# Patient Record
Sex: Male | Born: 1985 | Race: White | Hispanic: No | Marital: Single | Smoking: Never smoker
Health system: Southern US, Community
[De-identification: ages and names within clinical notes are randomized; demographics above are authoritative.]

## PROBLEM LIST (undated history)

## (undated) DIAGNOSIS — N390 Urinary tract infection, site not specified: Secondary | ICD-10-CM

## (undated) DIAGNOSIS — N200 Calculus of kidney: Secondary | ICD-10-CM

## (undated) DIAGNOSIS — Z87442 Personal history of urinary calculi: Secondary | ICD-10-CM

## (undated) DIAGNOSIS — G43909 Migraine, unspecified, not intractable, without status migrainosus: Secondary | ICD-10-CM

## (undated) HISTORY — PX: ABDOMINAL SURGERY: SHX537

## (undated) HISTORY — DX: Urinary tract infection, site not specified: N39.0

## (undated) HISTORY — DX: Calculus of kidney: N20.0

---

## 2006-11-09 ENCOUNTER — Emergency Department: Payer: Self-pay | Admitting: Emergency Medicine

## 2017-09-10 ENCOUNTER — Other Ambulatory Visit: Payer: Self-pay | Admitting: Family Medicine

## 2017-09-10 DIAGNOSIS — N6320 Unspecified lump in the left breast, unspecified quadrant: Secondary | ICD-10-CM

## 2017-09-22 ENCOUNTER — Ambulatory Visit
Admission: RE | Admit: 2017-09-22 | Discharge: 2017-09-22 | Disposition: A | Discharge status: Home / Self Care | Payer: Medicare Other | Source: Ambulatory Visit | Attending: Family Medicine | Admitting: Family Medicine

## 2017-09-22 ENCOUNTER — Other Ambulatory Visit: Payer: Self-pay

## 2017-09-22 ENCOUNTER — Encounter: Payer: Self-pay | Admitting: Emergency Medicine

## 2017-09-22 ENCOUNTER — Emergency Department
Admission: EM | Admit: 2017-09-22 | Discharge: 2017-09-22 | Disposition: A | Discharge status: Home / Self Care | Payer: Medicare Other | Attending: Emergency Medicine | Admitting: Emergency Medicine

## 2017-09-22 DIAGNOSIS — N62 Hypertrophy of breast: Secondary | ICD-10-CM | POA: Insufficient documentation

## 2017-09-22 DIAGNOSIS — N6320 Unspecified lump in the left breast, unspecified quadrant: Secondary | ICD-10-CM

## 2017-09-22 DIAGNOSIS — R55 Syncope and collapse: Secondary | ICD-10-CM | POA: Diagnosis not present

## 2017-09-22 DIAGNOSIS — N6342 Unspecified lump in left breast, subareolar: Secondary | ICD-10-CM | POA: Diagnosis not present

## 2017-09-22 DIAGNOSIS — N644 Mastodynia: Secondary | ICD-10-CM | POA: Insufficient documentation

## 2017-09-22 HISTORY — DX: Migraine, unspecified, not intractable, without status migrainosus: G43.909

## 2017-09-22 LAB — CBC
HCT: 42.8 % (ref 40.0–52.0)
HEMOGLOBIN: 15.1 g/dL (ref 13.0–18.0)
MCH: 31.5 pg (ref 26.0–34.0)
MCHC: 35.3 g/dL (ref 32.0–36.0)
MCV: 89.2 fL (ref 80.0–100.0)
Platelets: 279 10*3/uL (ref 150–440)
RBC: 4.81 MIL/uL (ref 4.40–5.90)
RDW: 12.2 % (ref 11.5–14.5)
WBC: 5.8 10*3/uL (ref 3.8–10.6)

## 2017-09-22 LAB — URINALYSIS, COMPLETE (UACMP) WITH MICROSCOPIC
BACTERIA UA: NONE SEEN
Bilirubin Urine: NEGATIVE
GLUCOSE, UA: NEGATIVE mg/dL
Hgb urine dipstick: NEGATIVE
KETONES UR: NEGATIVE mg/dL
Leukocytes, UA: NEGATIVE
Nitrite: NEGATIVE
PROTEIN: NEGATIVE mg/dL
SQUAMOUS EPITHELIAL / LPF: NONE SEEN
Specific Gravity, Urine: 1.013 (ref 1.005–1.030)
pH: 7 (ref 5.0–8.0)

## 2017-09-22 LAB — BASIC METABOLIC PANEL
ANION GAP: 8 (ref 5–15)
BUN: 16 mg/dL (ref 6–20)
CALCIUM: 8.9 mg/dL (ref 8.9–10.3)
CHLORIDE: 108 mmol/L (ref 101–111)
CO2: 24 mmol/L (ref 22–32)
Creatinine, Ser: 1.36 mg/dL — ABNORMAL HIGH (ref 0.61–1.24)
GFR calc Af Amer: >60 mL/min (ref >60)
GFR calc non Af Amer: >60 mL/min (ref >60)
GLUCOSE: 116 mg/dL — AB (ref 65–99)
Potassium: 3.9 mmol/L (ref 3.5–5.1)
Sodium: 140 mmol/L (ref 135–145)

## 2017-09-22 LAB — GLUCOSE, CAPILLARY: GLUCOSE-CAPILLARY: 86 mg/dL (ref 65–99)

## 2017-09-22 NOTE — ED Notes (Signed)
Patient ambulatory to restroom with steady gait and NAD noted.  

## 2017-09-22 NOTE — ED Triage Notes (Signed)
Patient was in outpatient radiology getting mammogram done and experienced syncopal episode. Per staff, patient did not hit head. Patient unresponsive for several seconds and aroused by use of ammonia. Patient denies any pain at this time. A&O x4.

## 2017-09-22 NOTE — ED Provider Notes (Signed)
[facility] Emergency Department Provider Note  ____________________________________________   I have reviewed the triage vital signs and the nursing notes.   HISTORY  Chief Complaint Loss of Consciousness    HPI Jeffery Jackson is a 31 y.o. male presents today complaining of "I feel fine". Patient states he has had chronic pain to a cyst in his left chest wall he is getting an ultrasound performed today and it was uncomfortable and he passed out from being uncomfortable. He has no personal or family history of early cardiac disease, no history of seizures he did not bite his tongue, he does not incontinent, he states he was in a painful situation and he was a little embarrassed that he passed out. He would like to go home. No history of PE or DVT, no history of early cardiac death in his family, he has no chest pain he has no complaints of any variety.     Past Medical History:  Diagnosis Date  . Migraine     There are no active problems to display for this patient.   Past Surgical History:  Procedure Laterality Date  . ABDOMINAL SURGERY      Prior to Admission medications   Not on File    Allergies Patient has no known allergies.  No family history on file.  Social History Social History  Substance Use Topics  . Smoking status: Never Smoker  . Smokeless tobacco: Not on file  . Alcohol use No    Review of Systems Constitutional: No fever/chills Eyes: No visual changes. ENT: No sore throat. No stiff neck no neck pain Cardiovascular: Denies chest pain. Respiratory: Denies shortness of breath. Gastrointestinal:   no vomiting.  No diarrhea.  No constipation. Genitourinary: Negative for dysuria. Musculoskeletal: Negative lower extremity swelling Skin: Negative for rash. Neurological: Negative for severe headaches, focal weakness or numbness.   ____________________________________________   PHYSICAL EXAM:  VITAL SIGNS: ED  Triage Vitals  Enc Vitals Group     BP 09/22/17 1200 115/65     Pulse Rate 09/22/17 1200 74     Resp 09/22/17 1300 19     Temp --      Temp src --      SpO2 09/22/17 1200 100 %     Weight 09/22/17 1144 155 lb (70.3 kg)     Height 09/22/17 1144  (1.727 m)     Head Circumference --      Peak Flow --      Pain Score 09/22/17 1143 0     Pain Loc --      Pain Edu? --      Excl. in GC? --     Constitutional: Alert and oriented. Well appearing and in no acute distress. Eyes: Conjunctivae are normal Head: Atraumatic HEENT: No congestion/rhinnorhea. Mucous membranes are moist.  Oropharynx non-erythematous chest wall: There is a small cystic structure which is not markedly tenderness red or inflamed in the left chest wall which patient states is been there for 2 years Neck:   Nontender with no meningismus, no masses, no stridor Cardiovascular: Normal rate, regular rhythm. Grossly normal heart sounds.  Good peripheral circulation. Respiratory: Normal respiratory effort.  No retractions. Lungs CTAB. Abdominal: Soft and nontender. No distention. No guarding no rebound Back:  There is no focal tenderness or step off.  there is no midline tenderness there are no lesions noted. there is no CVA tenderness  Musculoskeletal: No lower extremity tenderness, no upper extremity tenderness. No  joint effusions, no DVT signs strong distal pulses no edema Neurologic:  Normal speech and language. No gross focal neurologic deficits are appreciated.  Skin:  Skin is warm, dry and intact. No rash noted. Psychiatric: Mood and affect are normal. Speech and behavior are normal.  ____________________________________________   LABS (all labs ordered are listed, but only abnormal results are displayed)  Labs Reviewed  BASIC METABOLIC PANEL - Abnormal; Notable for the following:       Result Value   Glucose, Bld 116 (*)    Creatinine, Ser 1.36 (*)    All other components within normal limits  CBC   GLUCOSE, CAPILLARY  URINALYSIS, COMPLETE (UACMP) WITH MICROSCOPIC  CBG MONITORING, ED    Pertinent labs  results that were available during my care of the patient were reviewed by me and considered in my medical decision making (see chart for details). ____________________________________________  EKG  I personally interpreted any EKGs ordered by me or triage sinus rhythm rate 82 beats per an acute ST elevation or depression normal axis, QTC is 434, normal EKG ____________________________________________  RADIOLOGY  Pertinent labs & imaging results that were available during my care of the patient were reviewed by me and considered in my medical decision making (see chart for details). If possible, patient and/or family made aware of any abnormal findings. ____________________________________________    PROCEDURES  Procedure(s) performed: None  Procedures  Critical Care performed: None  ____________________________________________   INITIAL IMPRESSION / ASSESSMENT AND PLAN / ED COURSE  Pertinent labs & imaging results that were available during my care of the patient were reviewed by me and considered in my medical decision making (see chart for details).  patient passed out during a procedure that he found to be uncomfortable he states is from pain he states this is happened to him before. He has no ongoing symptoms or complaints and he would like to be discharged. This is not unreasonable. Basic blood work is reassuring.vital signs are reassuring history is reassuring EKG is reassuring, he will follow closely with primary care doctor. I will also defer further workup of his cystic structure which is chronic and unrelated to his presenting complaint.    ____________________________________________   FINAL CLINICAL IMPRESSION(S) / ED DIAGNOSES  Final diagnoses:  None      This chart was dictated using voice recognition software.  Despite best efforts to proofread,   errors can occur which can change meaning.      Jeanmarie Plant, MD 09/22/17 1332

## 2017-09-23 ENCOUNTER — Other Ambulatory Visit: Payer: Self-pay | Admitting: Family Medicine

## 2017-09-23 DIAGNOSIS — N6320 Unspecified lump in the left breast, unspecified quadrant: Secondary | ICD-10-CM

## 2017-09-28 ENCOUNTER — Ambulatory Visit: Payer: Medicare Other

## 2017-09-29 ENCOUNTER — Ambulatory Visit
Admission: RE | Admit: 2017-09-29 | Discharge: 2017-09-29 | Disposition: A | Discharge status: Home / Self Care | Payer: Medicare Other | Source: Ambulatory Visit | Attending: Family Medicine | Admitting: Family Medicine

## 2017-09-29 DIAGNOSIS — N6320 Unspecified lump in the left breast, unspecified quadrant: Secondary | ICD-10-CM | POA: Diagnosis present

## 2017-09-29 DIAGNOSIS — N644 Mastodynia: Secondary | ICD-10-CM | POA: Insufficient documentation

## 2017-09-29 DIAGNOSIS — R928 Other abnormal and inconclusive findings on diagnostic imaging of breast: Secondary | ICD-10-CM | POA: Insufficient documentation

## 2017-09-29 DIAGNOSIS — N62 Hypertrophy of breast: Secondary | ICD-10-CM | POA: Diagnosis not present

## 2017-09-29 DIAGNOSIS — N6321 Unspecified lump in the left breast, upper outer quadrant: Secondary | ICD-10-CM | POA: Insufficient documentation

## 2018-01-07 IMAGING — US US BREAST*L* LIMITED INC AXILLA
1 series · 5 of 5 positions shown · non-contrast
Comparison: Previous exam(s).

CLINICAL DATA: 30-year-old male returns for targeted ultrasound of
the left breast in region of tenderness. The patient initially
presented 09/22/2017 with left breast tenderness and a mammogram
demonstrated findings compatible with gynecomastia. An ultrasound
was recommended since the palpable marker was just above this
location, however the patient syncopized and therefore went to the
emergency department and the recommended ultrasound could not be
performed at the patient's last visit.

EXAM:
ULTRASOUND OF THE LEFT BREAST

[Series 1: us breast*left* limited inc axilla · 0.07mm/px · 5 of 5 slices shown]
[im 1/5]
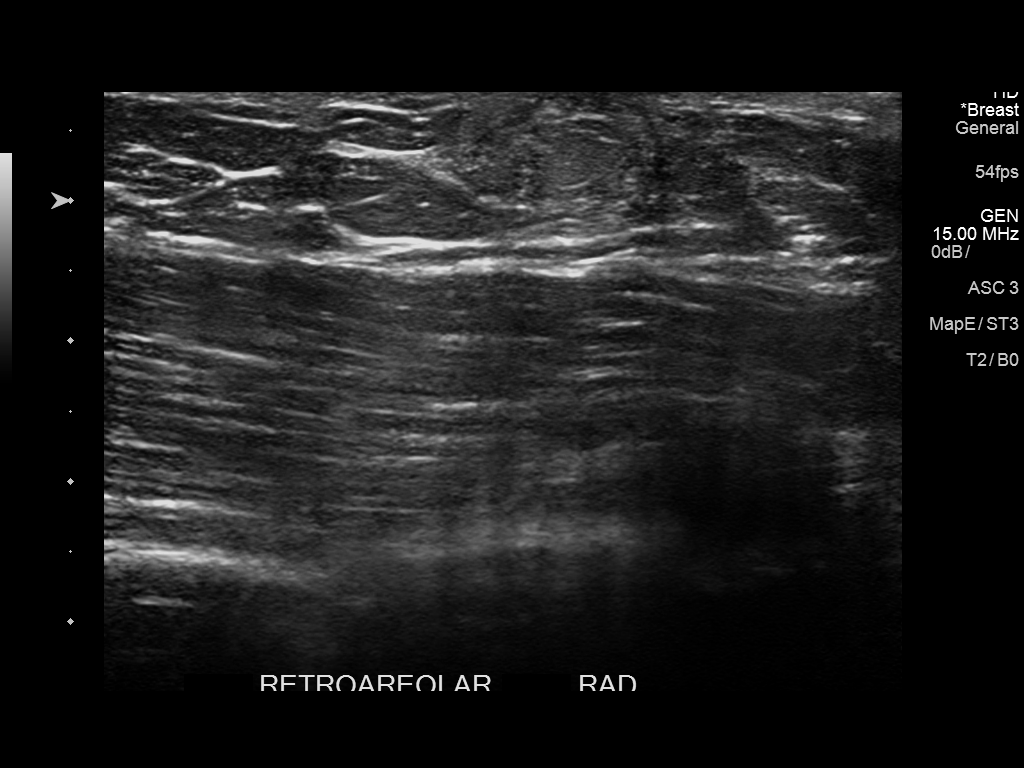
[im 2/5]
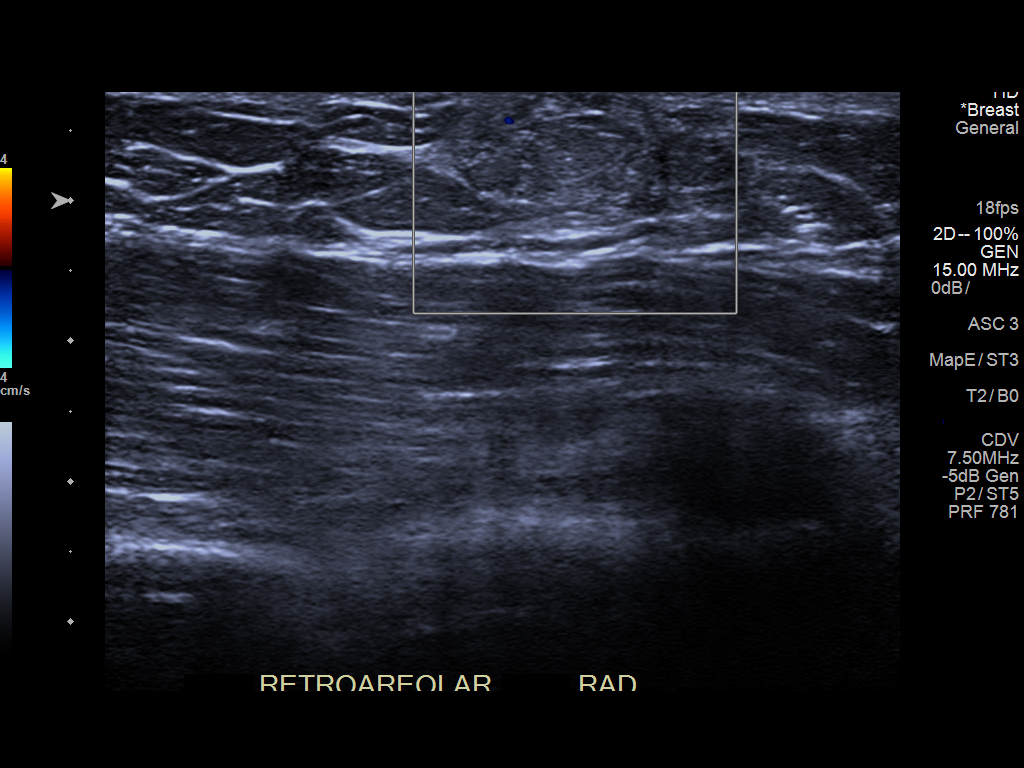
[im 3/5]
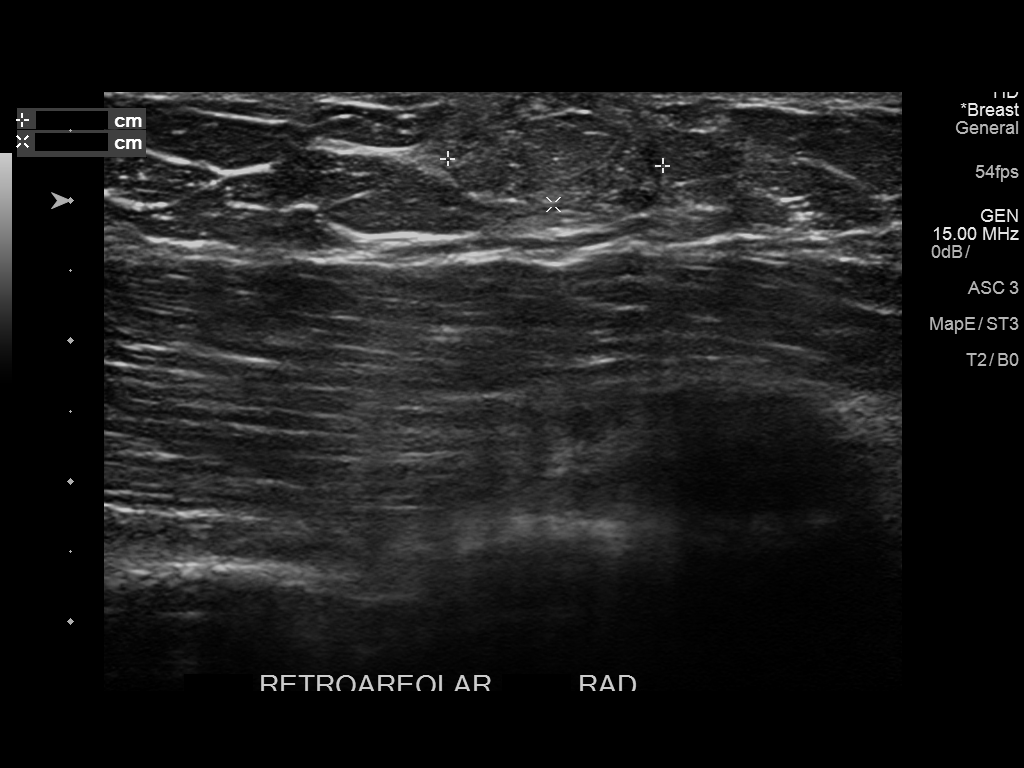
[im 4/5]
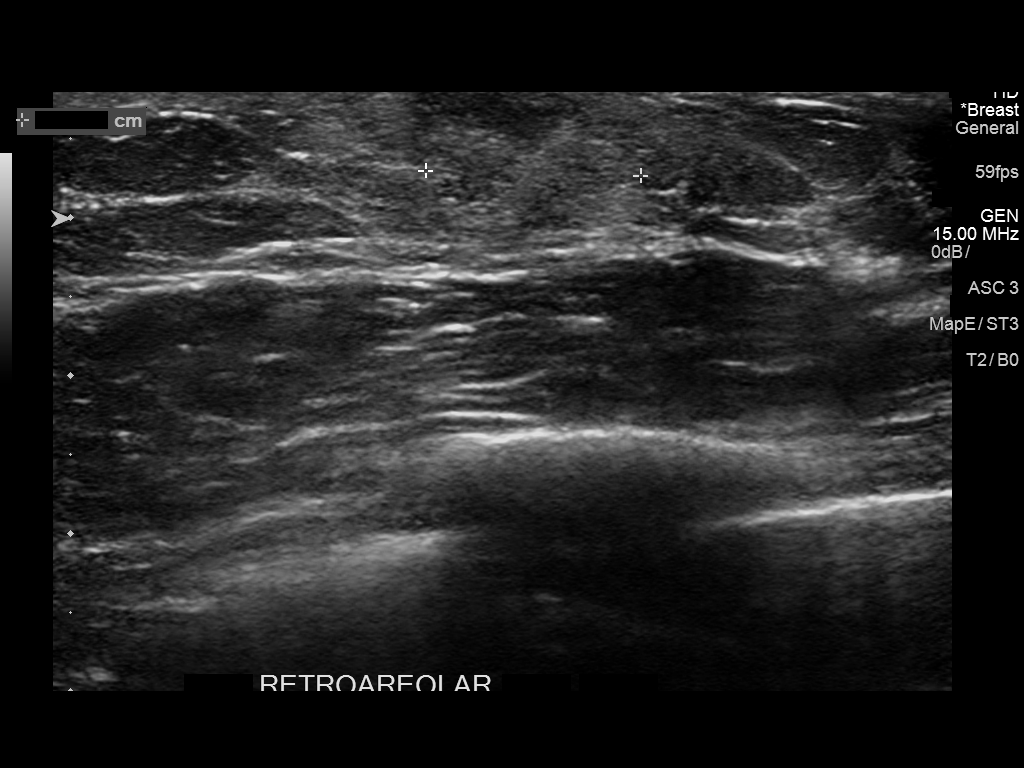
[im 5/5]
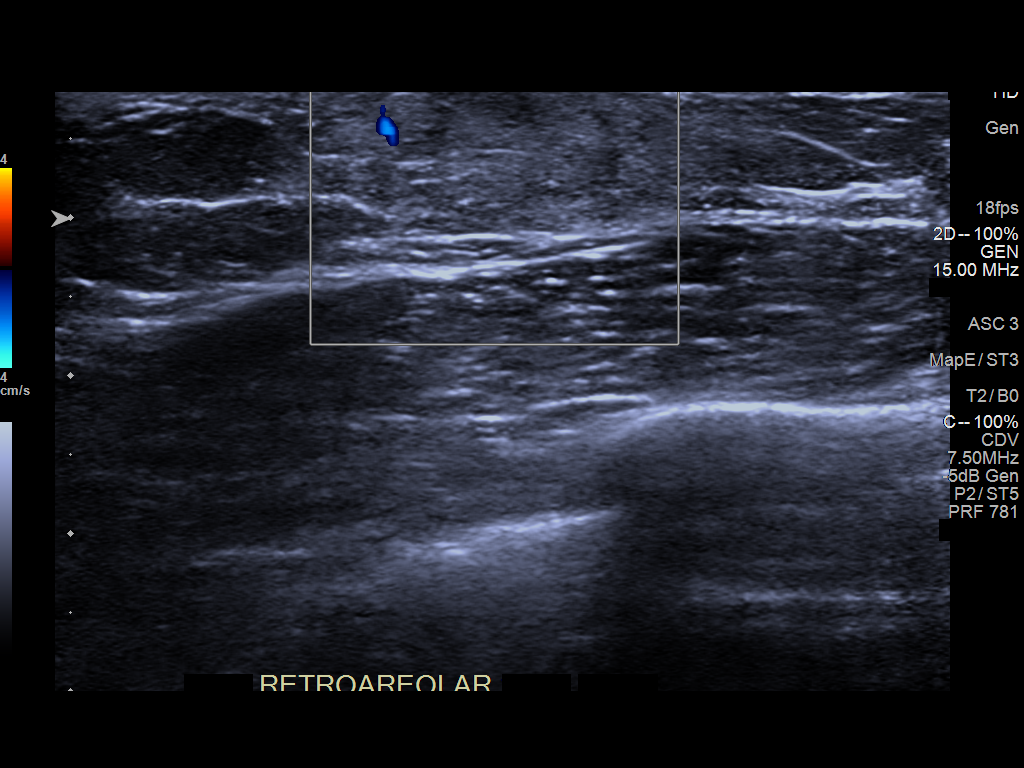

[5 of 5 positions shown; findings below may reference images not displayed]

FINDINGS: Physical examination at site of palpable concern reveals a
quarter-sized area of rubbery mobile soft tissue in the retroareolar
left breast. Targeted ultrasound of the left breast was performed
demonstrating a small area of fibroglandular tissue in the region of
pain measuring 1.4 x 0.9 x 1.5 cm. No suspicious masses or
abnormality seen.
IMPRESSION: Gynecomastia at site of palpable/ painful concern in the left
breast. No findings of malignancy in the left breast.

RECOMMENDATION:
Clinical follow-up as needed for left breast gynecomastia.

I have discussed the findings and recommendations with the patient.
Results were also provided in writing at the conclusion of the
visit. If applicable, a reminder letter will be sent to the patient
regarding the next appointment.

BI-RADS CATEGORY  2: Benign.

## 2018-01-12 ENCOUNTER — Other Ambulatory Visit: Payer: Self-pay

## 2018-01-12 ENCOUNTER — Encounter: Payer: Self-pay | Admitting: Emergency Medicine

## 2018-01-12 ENCOUNTER — Emergency Department
Admission: EM | Admit: 2018-01-12 | Discharge: 2018-01-12 | Disposition: A | Discharge status: Home / Self Care | Payer: Medicare Other | Attending: Emergency Medicine | Admitting: Emergency Medicine

## 2018-01-12 ENCOUNTER — Emergency Department: Payer: Medicare Other

## 2018-01-12 DIAGNOSIS — N289 Disorder of kidney and ureter, unspecified: Secondary | ICD-10-CM | POA: Diagnosis not present

## 2018-01-12 DIAGNOSIS — N1330 Unspecified hydronephrosis: Secondary | ICD-10-CM | POA: Insufficient documentation

## 2018-01-12 DIAGNOSIS — N23 Unspecified renal colic: Secondary | ICD-10-CM | POA: Diagnosis not present

## 2018-01-12 DIAGNOSIS — R109 Unspecified abdominal pain: Secondary | ICD-10-CM | POA: Diagnosis present

## 2018-01-12 DIAGNOSIS — N3000 Acute cystitis without hematuria: Secondary | ICD-10-CM | POA: Diagnosis not present

## 2018-01-12 LAB — URINALYSIS, COMPLETE (UACMP) WITH MICROSCOPIC
BILIRUBIN URINE: NEGATIVE
Glucose, UA: NEGATIVE mg/dL
KETONES UR: NEGATIVE mg/dL
LEUKOCYTES UA: NEGATIVE
NITRITE: NEGATIVE
Protein, ur: NEGATIVE mg/dL
SPECIFIC GRAVITY, URINE: 1.019 (ref 1.005–1.030)
Squamous Epithelial / LPF: NONE SEEN
pH: 7 (ref 5.0–8.0)

## 2018-01-12 LAB — COMPREHENSIVE METABOLIC PANEL
ALT: 38 U/L (ref 17–63)
ANION GAP: 7 (ref 5–15)
AST: 39 U/L (ref 15–41)
Albumin: 4.5 g/dL (ref 3.5–5.0)
Alkaline Phosphatase: 71 U/L (ref 38–126)
BILIRUBIN TOTAL: 0.6 mg/dL (ref 0.3–1.2)
BUN: 22 mg/dL — ABNORMAL HIGH (ref 6–20)
CALCIUM: 9.1 mg/dL (ref 8.9–10.3)
CO2: 23 mmol/L (ref 22–32)
Chloride: 110 mmol/L (ref 101–111)
Creatinine, Ser: 1.46 mg/dL — ABNORMAL HIGH (ref 0.61–1.24)
Glucose, Bld: 104 mg/dL — ABNORMAL HIGH (ref 65–99)
POTASSIUM: 3.7 mmol/L (ref 3.5–5.1)
Sodium: 140 mmol/L (ref 135–145)
TOTAL PROTEIN: 7.7 g/dL (ref 6.5–8.1)

## 2018-01-12 LAB — CBC
HCT: 41.6 % (ref 40.0–52.0)
HEMOGLOBIN: 14.2 g/dL (ref 13.0–18.0)
MCH: 31.3 pg (ref 26.0–34.0)
MCHC: 34.2 g/dL (ref 32.0–36.0)
MCV: 91.3 fL (ref 80.0–100.0)
Platelets: 290 10*3/uL (ref 150–440)
RBC: 4.56 MIL/uL (ref 4.40–5.90)
RDW: 12.5 % (ref 11.5–14.5)
WBC: 8 10*3/uL (ref 3.8–10.6)

## 2018-01-12 LAB — LIPASE, BLOOD: Lipase: 26 U/L (ref 11–51)

## 2018-01-12 MED ORDER — TAMSULOSIN HCL 0.4 MG PO CAPS: 0.4000 mg | ORAL_CAPSULE | Freq: Every day | ORAL | 0 refills | Status: DC

## 2018-01-12 MED ORDER — ONDANSETRON 4 MG PO TBDP: 4.0000 mg | ORAL_TABLET | Freq: Three times a day (TID) | ORAL | 0 refills | Status: DC | PRN

## 2018-01-12 MED ORDER — SODIUM CHLORIDE 0.9 % IV BOLUS (SEPSIS)
1000.0000 mL | Freq: Once | INTRAVENOUS | Status: AC
  Administered 2018-01-12: 1000 mL via INTRAVENOUS

## 2018-01-12 MED ORDER — OXYCODONE-ACETAMINOPHEN 5-325 MG PO TABS: 1.0000 | ORAL_TABLET | ORAL | 0 refills | Status: DC | PRN

## 2018-01-12 MED ORDER — KETOROLAC TROMETHAMINE 10 MG PO TABS: 10.0000 mg | ORAL_TABLET | Freq: Three times a day (TID) | ORAL | 0 refills | Status: DC | PRN

## 2018-01-12 MED ORDER — SULFAMETHOXAZOLE-TRIMETHOPRIM 400-80 MG PO TABS: 1.0000 | ORAL_TABLET | Freq: Two times a day (BID) | ORAL | 0 refills | Status: DC

## 2018-01-12 NOTE — ED Notes (Signed)
Pt c/o lower left abdominal pain x2weeks. Pt reports relief while sitting in a warm bathtub. Pt reports the pain radiating towards his back. Pt reports intermittent diarrhea. Pt denies n/v.

## 2018-01-12 NOTE — ED Provider Notes (Signed)
[facility] Emergency Department Provider Note  ____________________________________________  Time seen: Approximately 10:23 PM  I have reviewed the triage vital signs and the nursing notes.   HISTORY  Chief Complaint Abdominal Pain    HPI Jeffery Jackson is a 32 y.o. male, otherwise healthy, presenting with 2 weeks of abdominal pain, worse over the last several days.  The patient reports that for the past 2-3 weeks, he has had a right-sided abdominal pain, with right flank pain.  Over the past 2-3 days, he has had intermittent loose nonbloody stools, and nausea without vomiting.  He has had a decreased appetite.  No fevers or chills.  No dysuria, hematuria.  No history of renal colic.  He has tried Tylenol and Motrin alternating for his pain, which initially helped but is no longer helping  Past Medical History:  Diagnosis Date  . Migraine     There are no active problems to display for this patient.   Past Surgical History:  Procedure Laterality Date  . ABDOMINAL SURGERY      Current Outpatient Rx  . Order #: [id number] Class: Print  . Order #: [id number] Class: Print  . Order #: [id number] Class: Print  . Order #: [id number] Class: Print  . Order #: [id number] Class: Print    Allergies Patient has no known allergies.  No family history on file.  Social History Social History   Tobacco Use  . Smoking status: Never Smoker  . Smokeless tobacco: Never Used  Substance Use Topics  . Alcohol use: No  . Drug use: No    Review of Systems Constitutional: No fever/chills. Eyes: No visual changes. ENT: No sore throat. No congestion or rhinorrhea. Cardiovascular: Denies chest pain. Denies palpitations. Respiratory: Denies shortness of breath.  No cough. Gastrointestinal: Positive right-sided flank and abdominal pain.  Positive nausea, no vomiting.  Acid of diarrhea.  No constipation. Genitourinary: Negative for dysuria.  No urinary frequency.  No  hematuria. Musculoskeletal: Negative for back pain. Skin: Negative for rash. Neurological: Negative for headaches. No focal numbness, tingling or weakness.     ____________________________________________   PHYSICAL EXAM:  VITAL SIGNS: ED Triage Vitals  Enc Vitals Group     BP 01/12/18 1748 129/74     Pulse Rate 01/12/18 1748 80     Resp 01/12/18 1748 20     Temp 01/12/18 1748 98.2 F (36.8 C)     Temp Source 01/12/18 1748 Oral     SpO2 01/12/18 1748 100 %     Weight 01/12/18 1746 155 lb (70.3 kg)     Height --      Head Circumference --      Peak Flow --      Pain Score 01/12/18 1746 5     Pain Loc --      Pain Edu? --      Excl. in GC? --     Constitutional: Alert and oriented. Well appearing and in no acute distress. Answers questions appropriately. Eyes: Conjunctivae are normal.  EOMI. No scleral icterus. Head: Atraumatic. Nose: No congestion/rhinnorhea. Mouth/Throat: Mucous membranes are mildly dry.  Neck: No stridor.  Supple.  No meningismus. Cardiovascular: Normal rate, regular rhythm. No murmurs, rubs or gallops.  Respiratory: Normal respiratory effort.  No accessory muscle use or retractions. Lungs CTAB.  No wheezes, rales or ronchi. Gastrointestinal: The patient does have right CVA tenderness to palpation.  Soft, nontender and nondistended.  No guarding or rebound.  No peritoneal signs. Musculoskeletal: No LE edema.  Neurologic:  A&Ox3.  Speech is clear.  Face and smile are symmetric.  EOMI.  Moves all extremities well. Skin:  Skin is warm, dry and intact. No rash noted. Psychiatric: Mood and affect are normal. Speech and behavior are normal.  Normal judgement  ____________________________________________   LABS (all labs ordered are listed, but only abnormal results are displayed)  Labs Reviewed  COMPREHENSIVE METABOLIC PANEL - Abnormal; Notable for the following components:      Result Value   Glucose, Bld 104 (*)    BUN 22 (*)    Creatinine, Ser  1.46 (*)    All other components within normal limits  URINALYSIS, COMPLETE (UACMP) WITH MICROSCOPIC - Abnormal; Notable for the following components:   Color, Urine YELLOW (*)    APPearance TURBID (*)    Hgb urine dipstick SMALL (*)    Bacteria, UA RARE (*)    All other components within normal limits  LIPASE, BLOOD  CBC   ____________________________________________  EKG  Not indicated ____________________________________________  RADIOLOGY  Ct Renal Stone Study  Result Date: 01/12/2018 CLINICAL DATA:  Right abdomen pain for 2 weeks. EXAM: CT ABDOMEN AND PELVIS WITHOUT CONTRAST TECHNIQUE: Multidetector CT imaging of the abdomen and pelvis was performed following the standard protocol without IV contrast. COMPARISON:  None. FINDINGS: Lower chest: No acute abnormality. Hepatobiliary: No focal liver abnormality is seen. No gallstones, gallbladder wall thickening, or biliary dilatation. Pancreas: Unremarkable. No pancreatic ductal dilatation or surrounding inflammatory changes. Spleen: Normal in size without focal abnormality. Adrenals/Urinary Tract: The adrenal glands are normal. There is right hydroureteronephrosis due to obstruction by a 3.5 mm stone at the right ureteral vesicular junction. There is no left hydronephrosis. The bladder is normal. Stomach/Bowel: Stomach is within normal limits. Appendix appears normal. No evidence of bowel wall thickening, distention, or inflammatory changes. Vascular/Lymphatic: No significant vascular findings are present. No enlarged abdominal or pelvic lymph nodes. Reproductive: Prostate is unremarkable. Other: None. Musculoskeletal: No acute or significant osseous findings. IMPRESSION: Right hydroureteronephrosis due to obstruction by 3.5 mm stone at the right ureteral vesicular junction. Electronically Signed   By: Sherian Rein M.D.   On: 01/12/2018 20:32    ____________________________________________   PROCEDURES  Procedure(s) performed:  None  Procedures  Critical Care performed: No ____________________________________________   INITIAL IMPRESSION / ASSESSMENT AND PLAN / ED COURSE  Pertinent labs & imaging results that were available during my care of the patient were reviewed by me and considered in my medical decision making (see chart for details).  32 y.o. male, otherwise healthy, presenting with right flank pain, right-sided abdominal pain, progressively worsening.  Overall, the patient is hemodynamically stable.  Differential diagnosis includes gallbladder pathology, renal colic, appendicitis, viral or foodborne GI illness, musculoskeletal strain.  We will get a CT scan for further evaluation.  ED course: The patient's urinalysis does show some rare bacteria as well as white blood cells and red blood cells.  His creatinine is slightly elevated from his baseline, so he is being treated with fluids.  ----------------------------------------- 10:23 PM on 01/12/2018 -----------------------------------------  The patient's workup in the emergency department is positive for 3.5 mm stone at the right UVJ.  There is some associated hydroureteronephrosis.  He has some very mild renal insufficiency above his abnormal baseline at 1.45 from 1.35.  He has been given intravenous fluids for this, and I have reiterated the importance of drinking plenty of liquids.  The patient does have some bacteria in his urine and I will treat him for UTI.  He has been given a urinary strainer with a plan for outpatient urology follow-up.  I had an at length discussion with the patient and his mother about the results of his studies, expectant management, follow-up instructions as well as return precautions.  At this time, the patient is safe for discharge home.  ____________________________________________  FINAL CLINICAL IMPRESSION(S) / ED DIAGNOSES  Final diagnoses:  Renal colic on right side  Acute cystitis without hematuria   Hydroureteronephrosis  Acute renal insufficiency         NEW MEDICATIONS STARTED DURING THIS VISIT:  New Prescriptions   KETOROLAC (TORADOL) 10 MG TABLET    Take 1 tablet (10 mg total) by mouth every 8 (eight) hours as needed for moderate pain (with food).   ONDANSETRON (ZOFRAN ODT) 4 MG DISINTEGRATING TABLET    Take 1 tablet (4 mg total) by mouth every 8 (eight) hours as needed for nausea or vomiting.   OXYCODONE-ACETAMINOPHEN (PERCOCET) 5-325 MG TABLET    Take 1 tablet by mouth every 4 (four) hours as needed for severe pain.   SULFAMETHOXAZOLE-TRIMETHOPRIM (BACTRIM) 400-80 MG TABLET    Take 1 tablet by mouth 2 (two) times daily.   TAMSULOSIN (FLOMAX) 0.4 MG CAPS CAPSULE    Take 1 capsule (0.4 mg total) by mouth daily.      Rockne Menghini, MD 01/12/18 [phone]

## 2018-01-12 NOTE — ED Triage Notes (Signed)
Pt with RUQ pain for two weeks. Also diarrhea.

## 2018-01-12 NOTE — Discharge Instructions (Signed)
Please drink plenty of fluids to stay well-hydrated and to help your kidney stone pass.  Please take the entire course of antibiotics, even if you are feeling better.  Please strain your urine and if you catch the stone, bring it with you to your doctor's appointment.  Make a follow-up appointment with the urologist.  Return to the emergency department if you develop severe pain, nausea or vomiting, fever, or any other symptoms concerning to you.

## 2018-01-19 ENCOUNTER — Ambulatory Visit: Payer: Self-pay | Admitting: Urology

## 2018-01-29 ENCOUNTER — Ambulatory Visit (INDEPENDENT_AMBULATORY_CARE_PROVIDER_SITE_OTHER): Payer: Medicare Other | Admitting: Urology

## 2018-01-29 ENCOUNTER — Encounter: Payer: Self-pay | Admitting: Urology

## 2018-01-29 VITALS — BP 108/66 | HR 75 | Ht 67.0 in | Wt 169.2 lb

## 2018-01-29 DIAGNOSIS — N1330 Unspecified hydronephrosis: Secondary | ICD-10-CM | POA: Diagnosis not present

## 2018-01-29 DIAGNOSIS — N201 Calculus of ureter: Secondary | ICD-10-CM

## 2018-01-29 LAB — MICROSCOPIC EXAMINATION
Bacteria, UA: NONE SEEN
EPITHELIAL CELLS (NON RENAL): NONE SEEN /HPF (ref 0–10)
RBC, UA: NONE SEEN /hpf (ref 0–?)
WBC UA: NONE SEEN /HPF (ref 0–?)

## 2018-01-29 LAB — URINALYSIS, COMPLETE
Bilirubin, UA: NEGATIVE
GLUCOSE, UA: NEGATIVE
Ketones, UA: NEGATIVE
LEUKOCYTES UA: NEGATIVE
NITRITE UA: NEGATIVE
RBC, UA: NEGATIVE
SPEC GRAV UA: 1.015 (ref 1.005–1.030)
Urobilinogen, Ur: 0.2 mg/dL (ref 0.2–1.0)
pH, UA: 8.5 — ABNORMAL HIGH (ref 5.0–7.5)

## 2018-01-29 NOTE — Progress Notes (Signed)
01/29/2018 3:47 PM   Cristal Deer Norberta Keens October 22, 1986 [id number]  Referring provider: [facility] [address] [place], [place] [address]  Chief Complaint  Patient presents with  . Nephrolithiasis    HPI: Irby Fails is a 32 year old male who presented to the [facility] ED on 01/12/2018 with a 2-week history of abdominal pain got worse over the preceding several days.  He complained of right lower quadrant abdominal pain radiating to the right flank.  There were no identifiable precipitating, aggravating or alleviating factors.  He had nausea without vomiting.  He denied fever or chills.  A noncontrast CT of the abdomen and pelvis was performed which showed moderate right hydronephrosis/hydroureter with a 3.5 mm right UVJ stone.  His pain was controlled in the ED and discharged on oral analgesics, antiemetics and tamsulosin.  He was also treated with Septra for microhematuria/pyuria although a urine culture was not performed.  His pain resolved after 1 week and is been asymptomatic since that time.  He is not aware of passing a stone.  There is no previous history of stone disease or other urologic problems.   PMH: Past Medical History:  Diagnosis Date  . Kidney stone   . Migraine   . Urinary tract infection     Surgical History: Past Surgical History:  Procedure Laterality Date  . ABDOMINAL SURGERY      Home Medications:  Allergies as of 01/29/2018   No Known Allergies     Medication List        Accurate as of 01/29/18  3:47 PM. Always use your most recent med list.          Magnesium 300 MG Caps Take by mouth.   topiramate 200 MG tablet Commonly known as:  TOPAMAX TAKE 1 TABLET (200 MG TOTAL) BY MOUTH TWO (2) TIMES A DAY.       Allergies: No Known Allergies  Family History: History reviewed. No pertinent family history.  Social History:  reports that  has never smoked. he has never used smokeless  tobacco. He reports that he does not drink alcohol or use drugs.  ROS: UROLOGY Frequent Urination?: No Hard to postpone urination?: No Burning/pain with urination?: No Get up at night to urinate?: Yes Leakage of urine?: No Urine stream starts and stops?: No Trouble starting stream?: No Do you have to strain to urinate?: No Blood in urine?: No Urinary tract infection?: No Sexually transmitted disease?: No Injury to kidneys or bladder?: No Painful intercourse?: No Weak stream?: No Erection problems?: No Penile pain?: No  Gastrointestinal Nausea?: No Vomiting?: No Indigestion/heartburn?: No Diarrhea?: No Constipation?: No  Constitutional Fever: No Night sweats?: No Weight loss?: No Fatigue?: No  Skin Skin rash/lesions?: No Itching?: No  Eyes Blurred vision?: No Double vision?: No  Ears/Nose/Throat Sore throat?: No Sinus problems?: No  Hematologic/Lymphatic Swollen glands?: No Easy bruising?: No  Cardiovascular Leg swelling?: No Chest pain?: No  Respiratory Cough?: No Shortness of breath?: No  Endocrine Excessive thirst?: No  Musculoskeletal Back pain?: No Joint pain?: No  Neurological Headaches?: No Dizziness?: No  Psychologic Depression?: No Anxiety?: No  Physical Exam: BP 108/66 (BP Location: Left Arm, Patient Position: Sitting, Cuff Size: Large)   Pulse 75   Ht 5\' 7"  (1.702 m)   Wt 169 lb 3.2 oz (76.7 kg)   SpO2 99%   BMI 26.50 kg/m   Constitutional:  Alert and oriented, No acute distress. HEENT: [place] AT, moist mucus membranes.  Trachea midline, no masses. Cardiovascular: No clubbing, cyanosis, or edema. Respiratory: Normal respiratory effort, no increased work of breathing. GI: Abdomen is soft, nontender, nondistended, no abdominal masses GU: No CVA tenderness.  Skin: No rashes, bruises or suspicious lesions. Lymph: No cervical or inguinal adenopathy. Neurologic: Grossly intact, no focal deficits, moving all 4  extremities. Psychiatric: Normal mood and affect.  Laboratory Data: Lab Results  Component Value Date   WBC 8.0 01/12/2018   HGB 14.2 01/12/2018   HCT 41.6 01/12/2018   MCV 91.3 01/12/2018   PLT 290 01/12/2018    Lab Results  Component Value Date   CREATININE 1.46 (H) 01/12/2018     Urinalysis Lab Results  Component Value Date   APPEARANCEUR TURBID (A) 01/12/2018   LEUKOCYTESUR NEGATIVE 01/12/2018   PROTEINUR NEGATIVE 01/12/2018   GLUCOSEU NEGATIVE 01/12/2018   RBCU 6-30 01/12/2018   BILIRUBINUR NEGATIVE 01/12/2018   NITRITE NEGATIVE 01/12/2018    Lab Results  Component Value Date   BACTERIA RARE (A) 01/12/2018    Pertinent Imaging: CT images reviewed  Results for orders placed during the hospital encounter of 01/12/18  CT RENAL STONE STUDY   Narrative CLINICAL DATA:  Right abdomen pain for 2 weeks.  EXAM: CT ABDOMEN AND PELVIS WITHOUT CONTRAST  TECHNIQUE: Multidetector CT imaging of the abdomen and pelvis was performed following the standard protocol without IV contrast.  COMPARISON:  None.  FINDINGS: Lower chest: No acute abnormality.  Hepatobiliary: No focal liver abnormality is seen. No gallstones, gallbladder wall thickening, or biliary dilatation.  Pancreas: Unremarkable. No pancreatic ductal dilatation or surrounding inflammatory changes.  Spleen: Normal in size without focal abnormality.  Adrenals/Urinary Tract: The adrenal glands are normal. There is right hydroureteronephrosis due to obstruction by a 3.5 mm stone at the right ureteral vesicular junction. There is no left hydronephrosis. The bladder is normal.  Stomach/Bowel: Stomach is within normal limits. Appendix appears normal. No evidence of bowel wall thickening, distention, or inflammatory changes.  Vascular/Lymphatic: No significant vascular findings are present. No enlarged abdominal or pelvic lymph nodes.  Reproductive: Prostate is unremarkable.  Other:  None.  Musculoskeletal: No acute or significant osseous findings.  IMPRESSION: Right hydroureteronephrosis due to obstruction by 3.5 mm stone at the right ureteral vesicular junction.   Electronically Signed   By: Sherian ReinWei-Chen  Lin M.D.   On: 01/12/2018 20:32     Assessment & Plan:  32 year old male with recently diagnosed right UVJ calculus who is now asymptomatic.  Urinalysis today was clear.  He has most likely passed his stone.  Will obtain a follow-up KUB and renal ultrasound.  He is a first-time stone former.  Discussed obtaining a metabolic evaluation however they would like to hold off.  General stone prevention guidelines were discussed including increasing water intake keep urine output greater than 2 L/day, dietary oxalate moderation, low-sodium diet and increased intake of citrus beverages.  There were provided literature on stone prevention.  If his KUB and renal ultrasound are unremarkable would recommend a follow-up KUB in 6 months.  1. Hydroureteronephrosis  - Urinalysis, Complete - Ultrasound renal complete; Future  2. Ureteral calculus  - Abdomen 1 view (KUB); Future   No Follow-up on file.  Riki AltesScott C Stoioff, MD  [facility] [address], [address] [place], [place] [address] [phone]

## 2018-02-01 ENCOUNTER — Encounter: Payer: Self-pay | Admitting: Urology

## 2018-02-12 ENCOUNTER — Ambulatory Visit
Admission: RE | Admit: 2018-02-12 | Discharge: 2018-02-12 | Disposition: A | Discharge status: Home / Self Care | Payer: Medicare Other | Source: Ambulatory Visit | Attending: Urology | Admitting: Urology

## 2018-02-12 DIAGNOSIS — N201 Calculus of ureter: Secondary | ICD-10-CM

## 2018-02-12 DIAGNOSIS — N1330 Unspecified hydronephrosis: Secondary | ICD-10-CM

## 2018-02-17 ENCOUNTER — Telehealth: Payer: Self-pay | Admitting: Urology

## 2018-02-17 NOTE — Telephone Encounter (Signed)
Patient's mother called the office today looking for results of KUB and ultrasound.  I read her the following message from Dr. Lonna CobbStoioff:  KUB showed his distal ureteral calculus is no longer present and hydronephrosis has resolved on ultrasound indicating a passed stone. Recommend a 5977-month follow-up with a KUB. He should drink approximately 3 L of water per day to keep his urine output greater than 2 L. Would recommend sending him the booklet on ABCs of stone prevention.  Patient's mother stated that she would check with the patient and call back to schedule a 6 month follow up with a KUB prior.

## 2018-02-19 ENCOUNTER — Telehealth: Payer: Self-pay

## 2018-02-19 NOTE — Telephone Encounter (Signed)
-----   Message from Riki AltesScott C Stoioff, MD sent at 02/14/2018 11:32 AM EST ----- KUB showed his distal ureteral calculus is no longer present and hydronephrosis has resolved on ultrasound indicating a passed stone.  Recommend a 5910-month follow-up with a KUB.  He should drink approximately 3 L of water per day to keep his urine output greater than 2 L.  Would recommend sending him the booklet on ABCs of stone prevention.

## 2018-02-19 NOTE — Telephone Encounter (Signed)
Letter and stone ABCs sent.

## 2018-05-23 IMAGING — US US RENAL
1 series · 14 of 25 positions shown · non-contrast
Comparison: CT scan of January 12, 2018.

CLINICAL DATA: Hydroureteronephrosis.

EXAM:
RENAL / URINARY TRACT ULTRASOUND COMPLETE

[Series 1: us renal · 0.19mm/px · 14 of 35 slices shown]
[im 1/35]
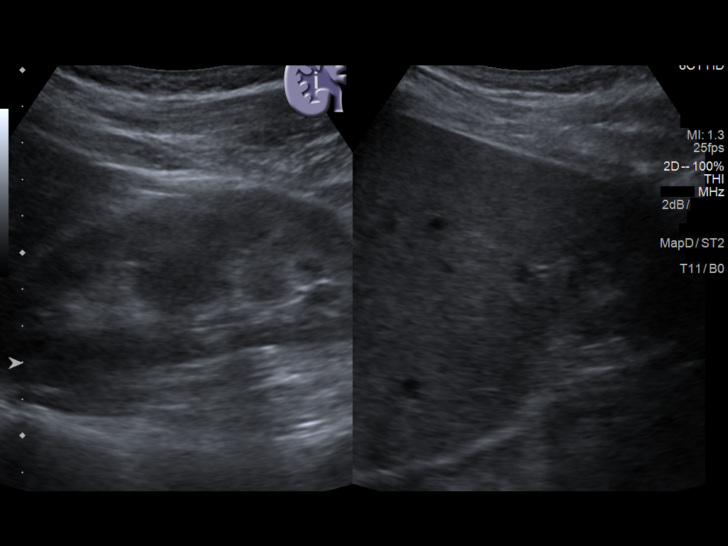
[im 3/35]
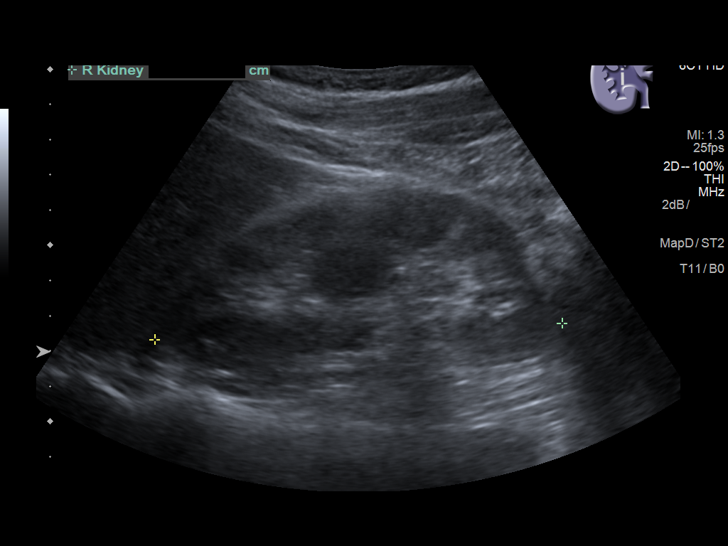
[im 6/35]
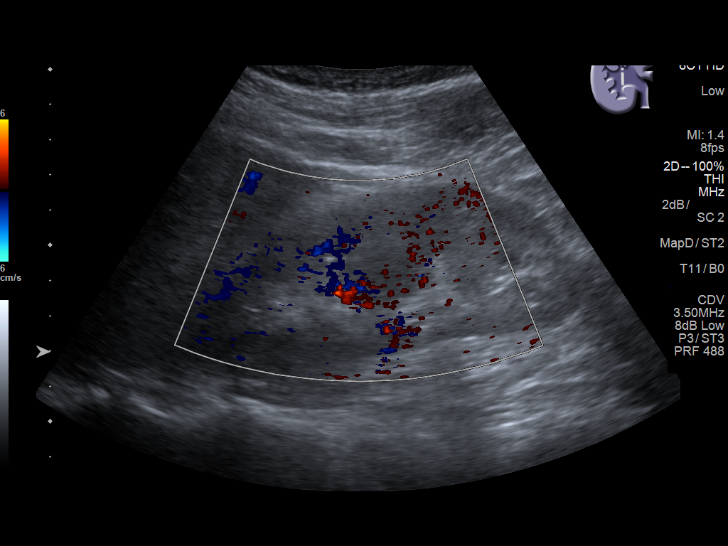
[im 9/35]
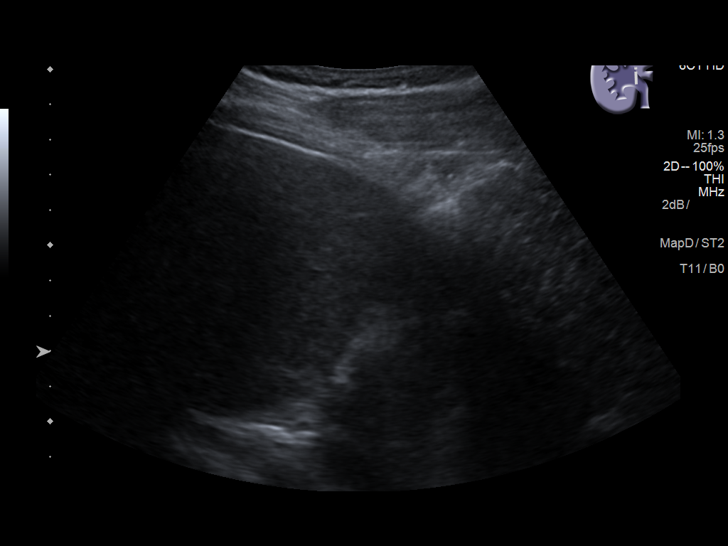
[im 12/35]
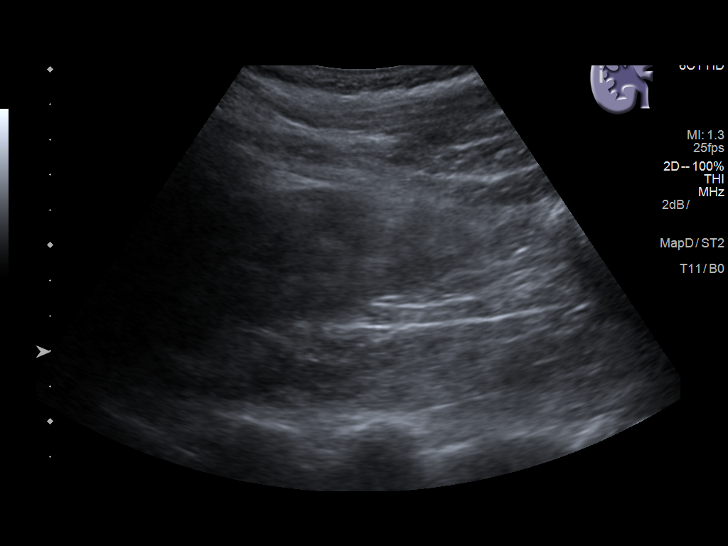
[im 13/35]
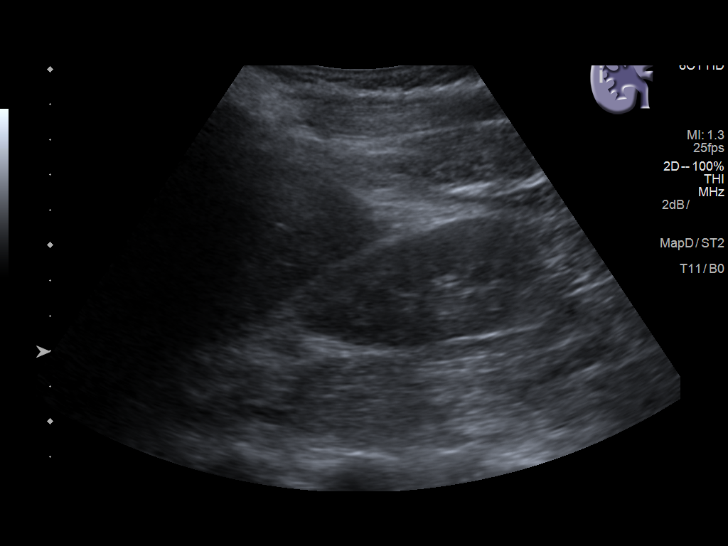
[im 16/35]
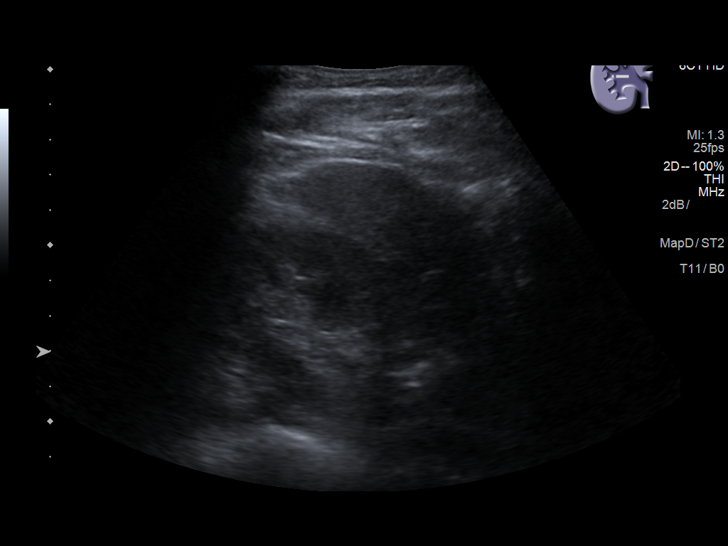
[im 19/35]
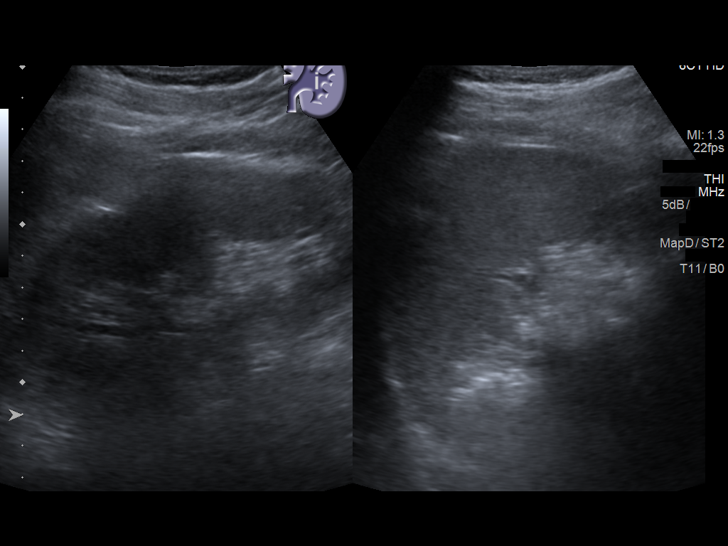
[im 22/35]
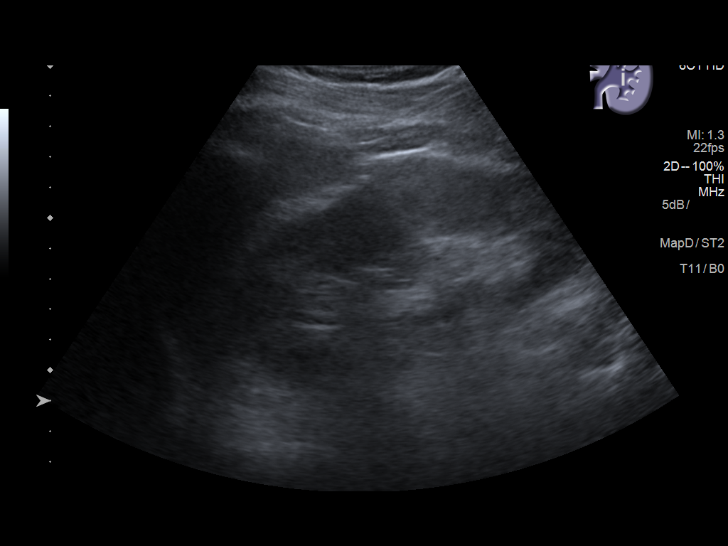
[im 23/35]
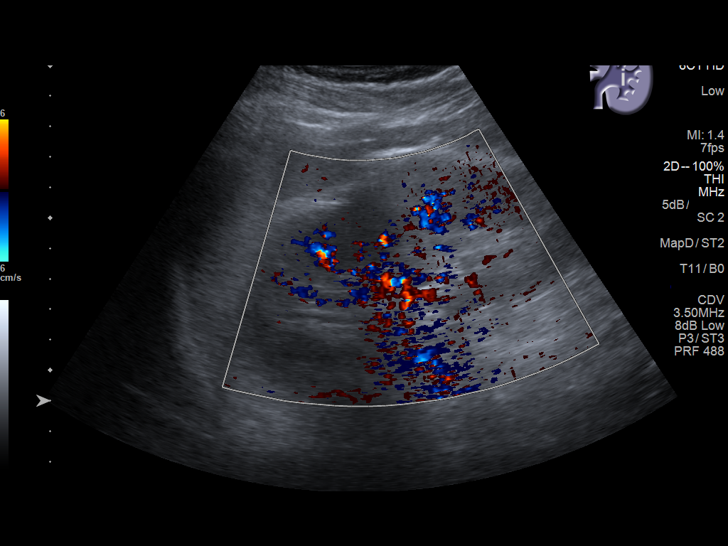
[im 26/35]
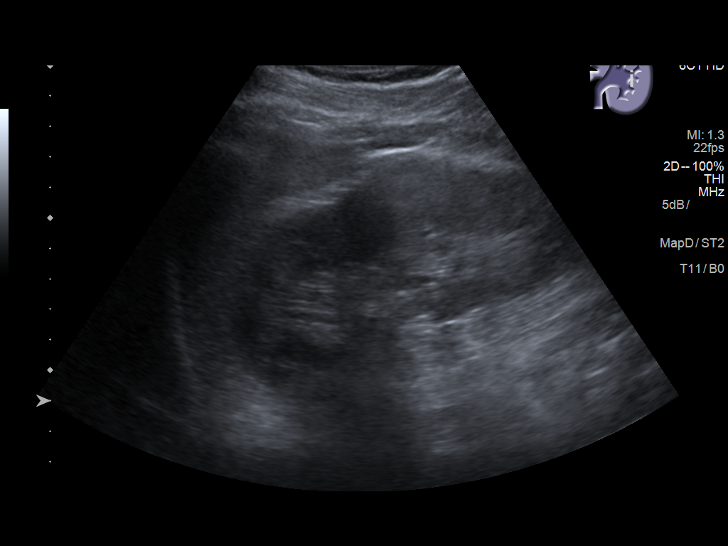
[im 29/35]
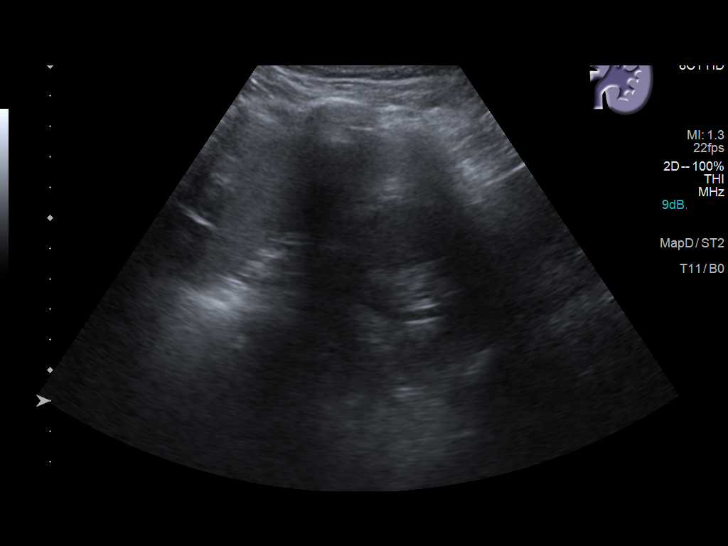
[im 32/35]
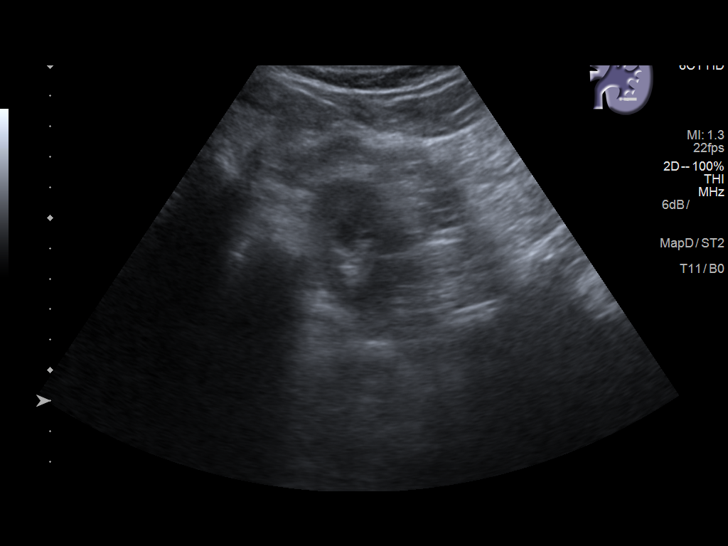
[im 35/35]
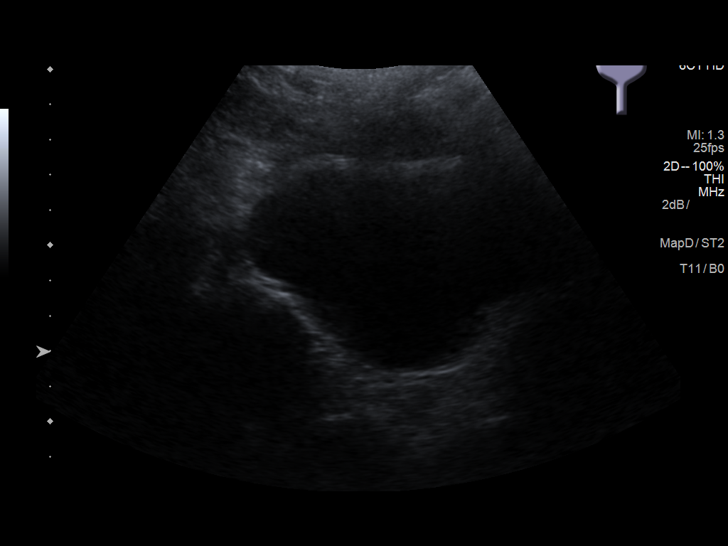

[14 of 25 positions shown; findings below may reference images not displayed]

FINDINGS: Right Kidney:

Length: 11.6 cm. Echogenicity within normal limits. No mass or
hydronephrosis visualized.

Left Kidney:

Length: 11.8 cm. Echogenicity within normal limits. No mass or
hydronephrosis visualized.

Bladder:

Appears normal for degree of bladder distention.
IMPRESSION: Normal renal ultrasound.

## 2018-07-13 IMAGING — CT CT RENAL STONE PROTOCOL
3 of 4 series · 8 of 46 positions shown, 15 images · non-contrast
Comparison: None.

CLINICAL DATA: Right abdomen pain for 2 weeks.

EXAM:
CT ABDOMEN AND PELVIS WITHOUT CONTRAST
TECHNIQUE: Multidetector CT imaging of the abdomen and pelvis was performed
following the standard protocol without IV contrast.

[Series 4: lung bases · axial · 0.65mm/px · z∈[-115,-50]mm · 4 of 23 slices shown, 9 images]
[im 5/23  soft-tissue]
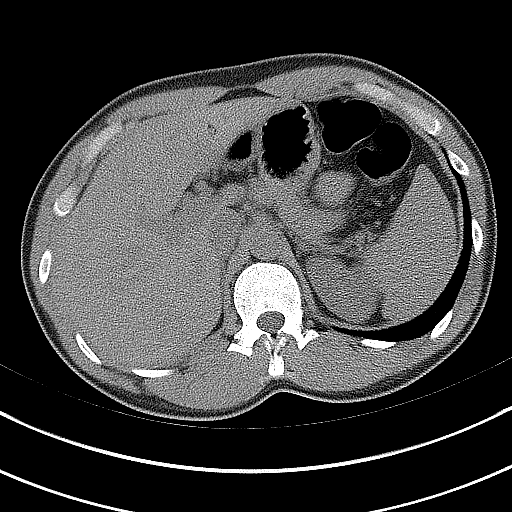
[im 5/23  lung]
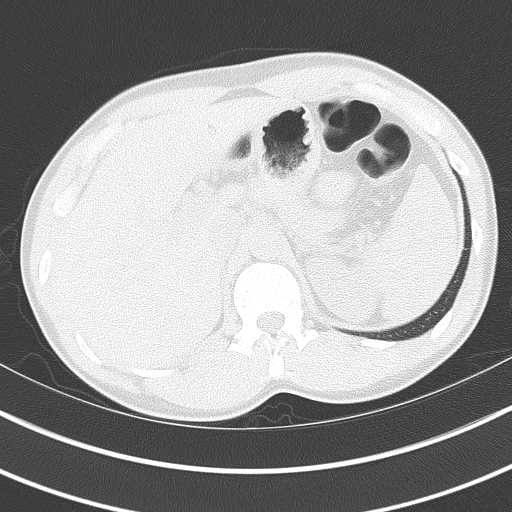
[im 5/23  bone]
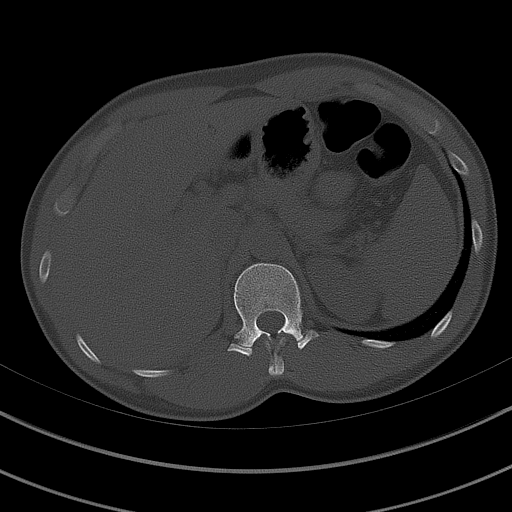
[im 9/23  soft-tissue]
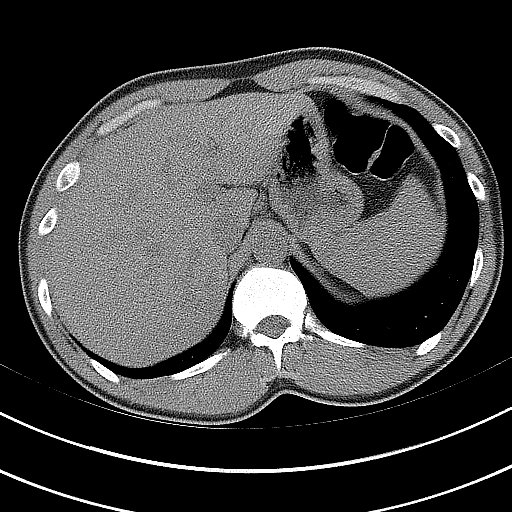
[im 9/23  lung]
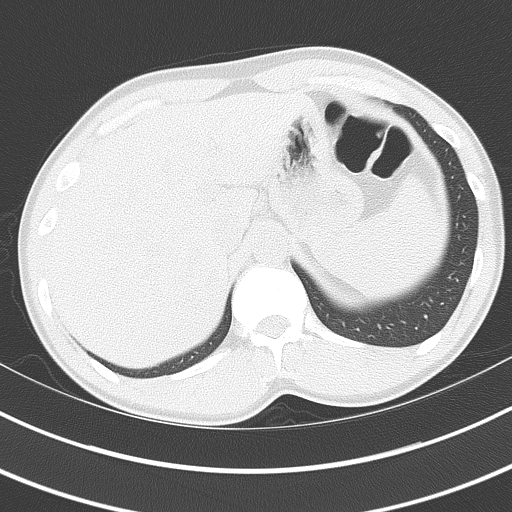
[im 14/23  soft-tissue]
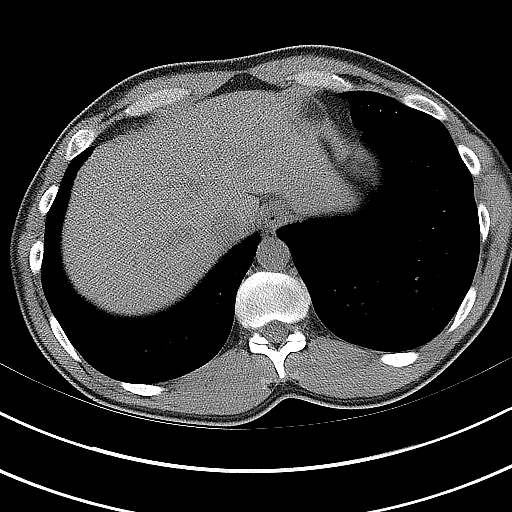
[im 14/23  lung]
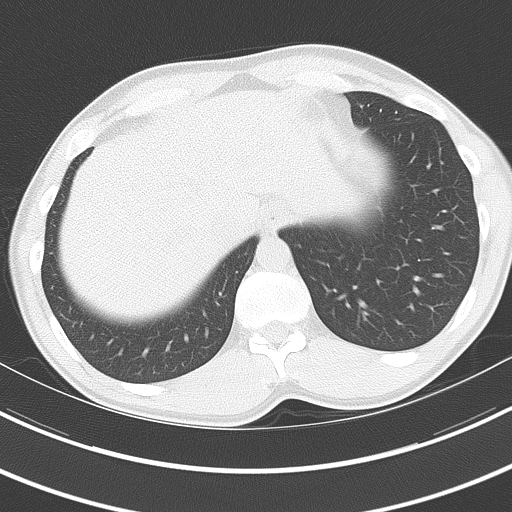
[im 18/23  soft-tissue]
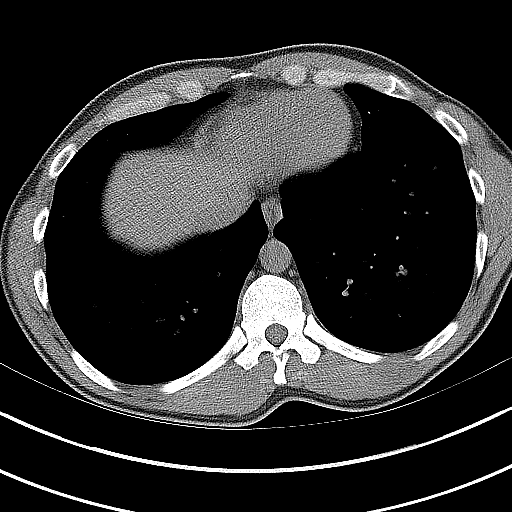
[im 18/23  lung]
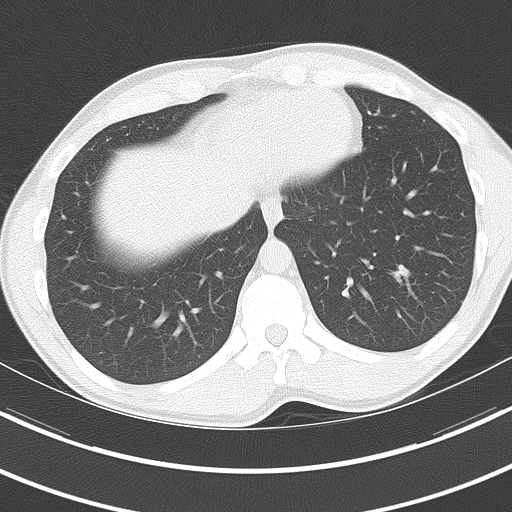

[Series 5: coronal · coronal · 0.70mm/px · 3 of 117 slices shown, 4 images]
[im 39/117  soft-tissue]
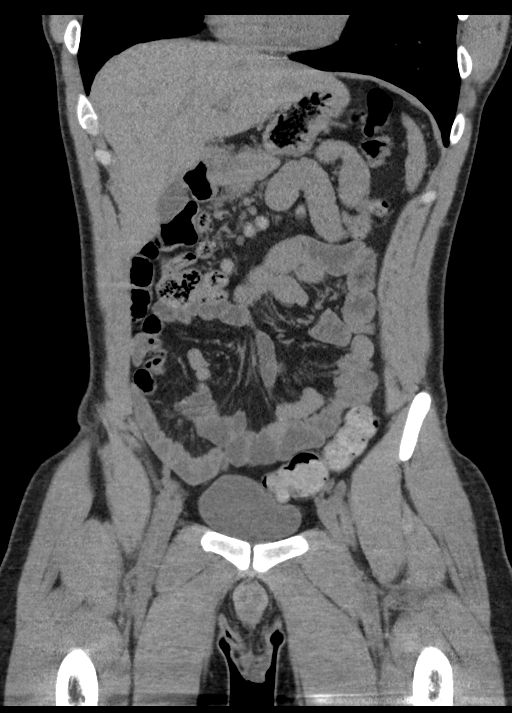
[im 52/117  soft-tissue]
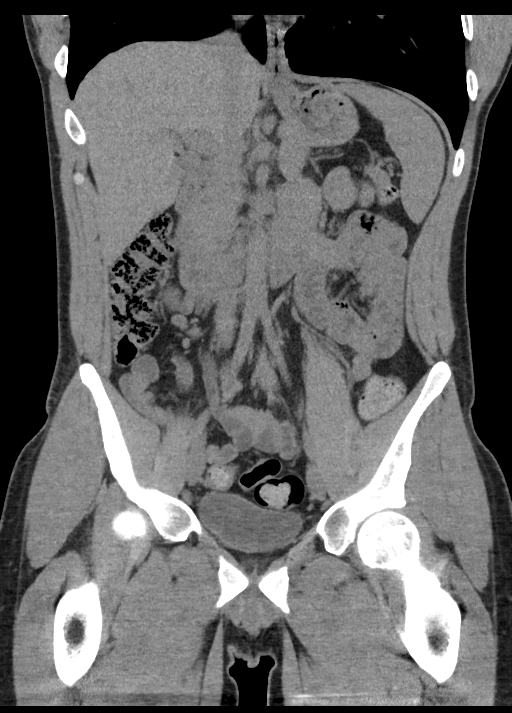
[im 52/117  bone]
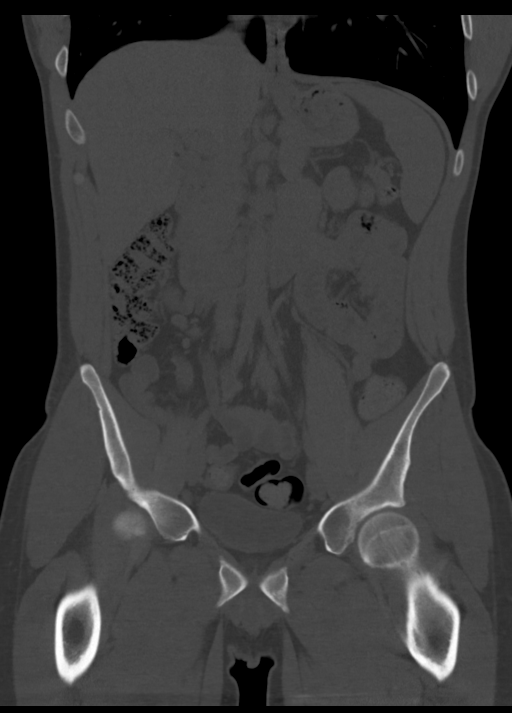
[im 65/117  soft-tissue]
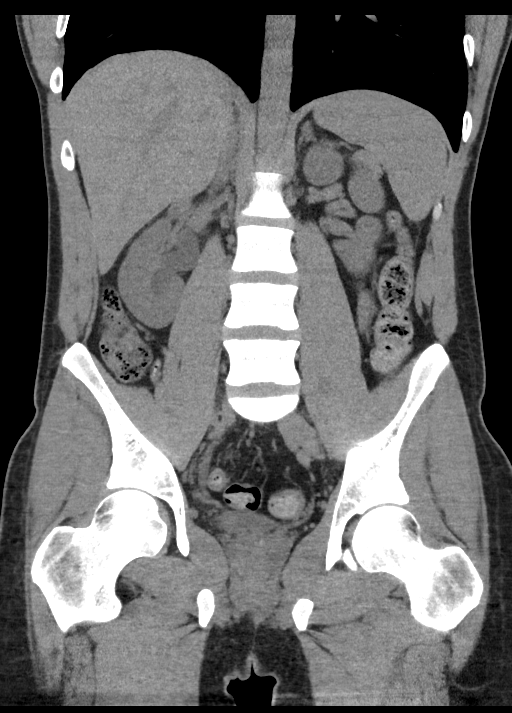

[Series 6: sagittal · sagittal · 0.54mm/px · 1 of 162 slices shown, 2 images]
[im 54/162  soft-tissue]
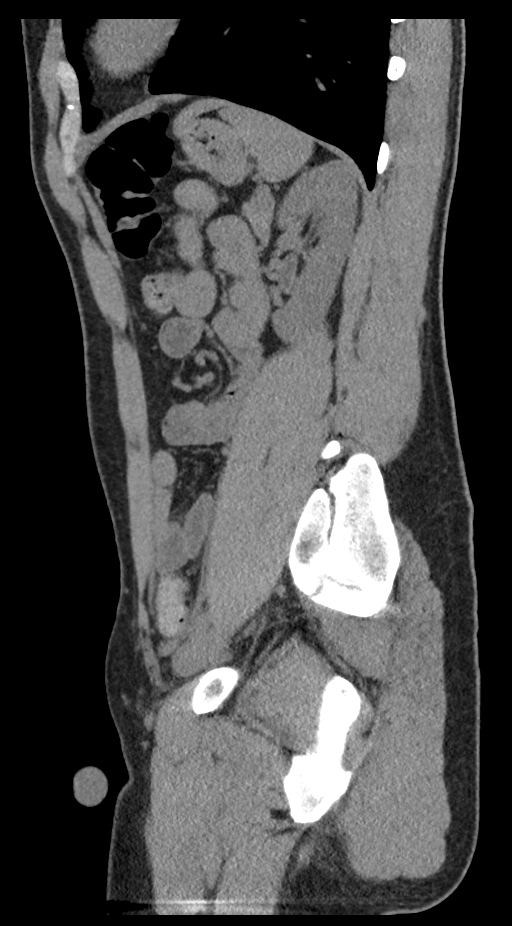
[im 54/162  bone]
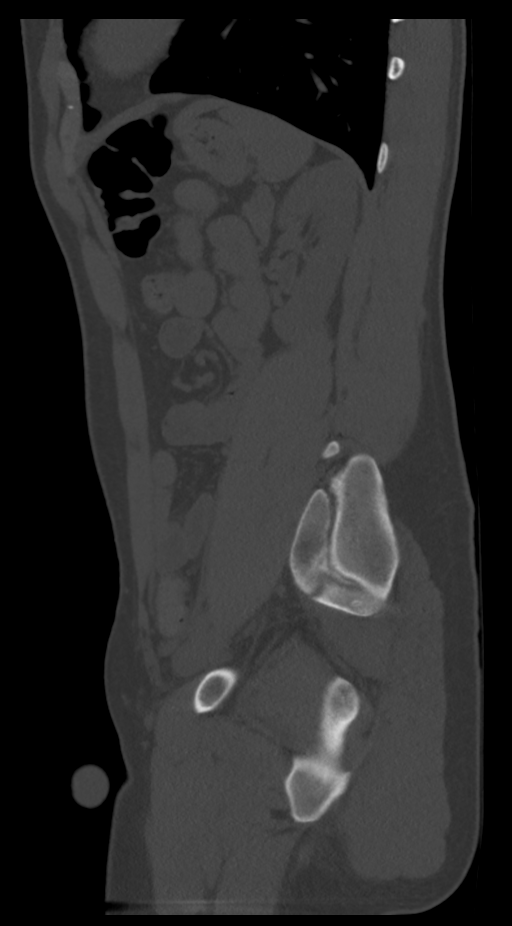

[8 of 46 positions shown; findings below may reference images not displayed]

FINDINGS: Lower chest: No acute abnormality.

Hepatobiliary: No focal liver abnormality is seen. No gallstones,
gallbladder wall thickening, or biliary dilatation.

Pancreas: Unremarkable. No pancreatic ductal dilatation or
surrounding inflammatory changes.

Spleen: Normal in size without focal abnormality.

Adrenals/Urinary Tract: The adrenal glands are normal. There is
right hydroureteronephrosis due to obstruction by a 3.5 mm stone at
the right ureteral vesicular junction. There is no left
hydronephrosis. The bladder is normal.

Stomach/Bowel: Stomach is within normal limits. Appendix appears
normal. No evidence of bowel wall thickening, distention, or
inflammatory changes.

Vascular/Lymphatic: No significant vascular findings are present. No
enlarged abdominal or pelvic lymph nodes.

Reproductive: Prostate is unremarkable.

Other: None.

Musculoskeletal: No acute or significant osseous findings.
IMPRESSION: Right hydroureteronephrosis due to obstruction by 3.5 mm stone at
the right ureteral vesicular junction.

## 2018-08-13 IMAGING — CR DG ABDOMEN 1V
1 series · 1 of 1 positions shown · non-contrast
Comparison: CT 01/12/2018.

CLINICAL DATA: Right-sided ureteral stone.

EXAM:
ABDOMEN - 1 VIEW

[dg abd 1 view]
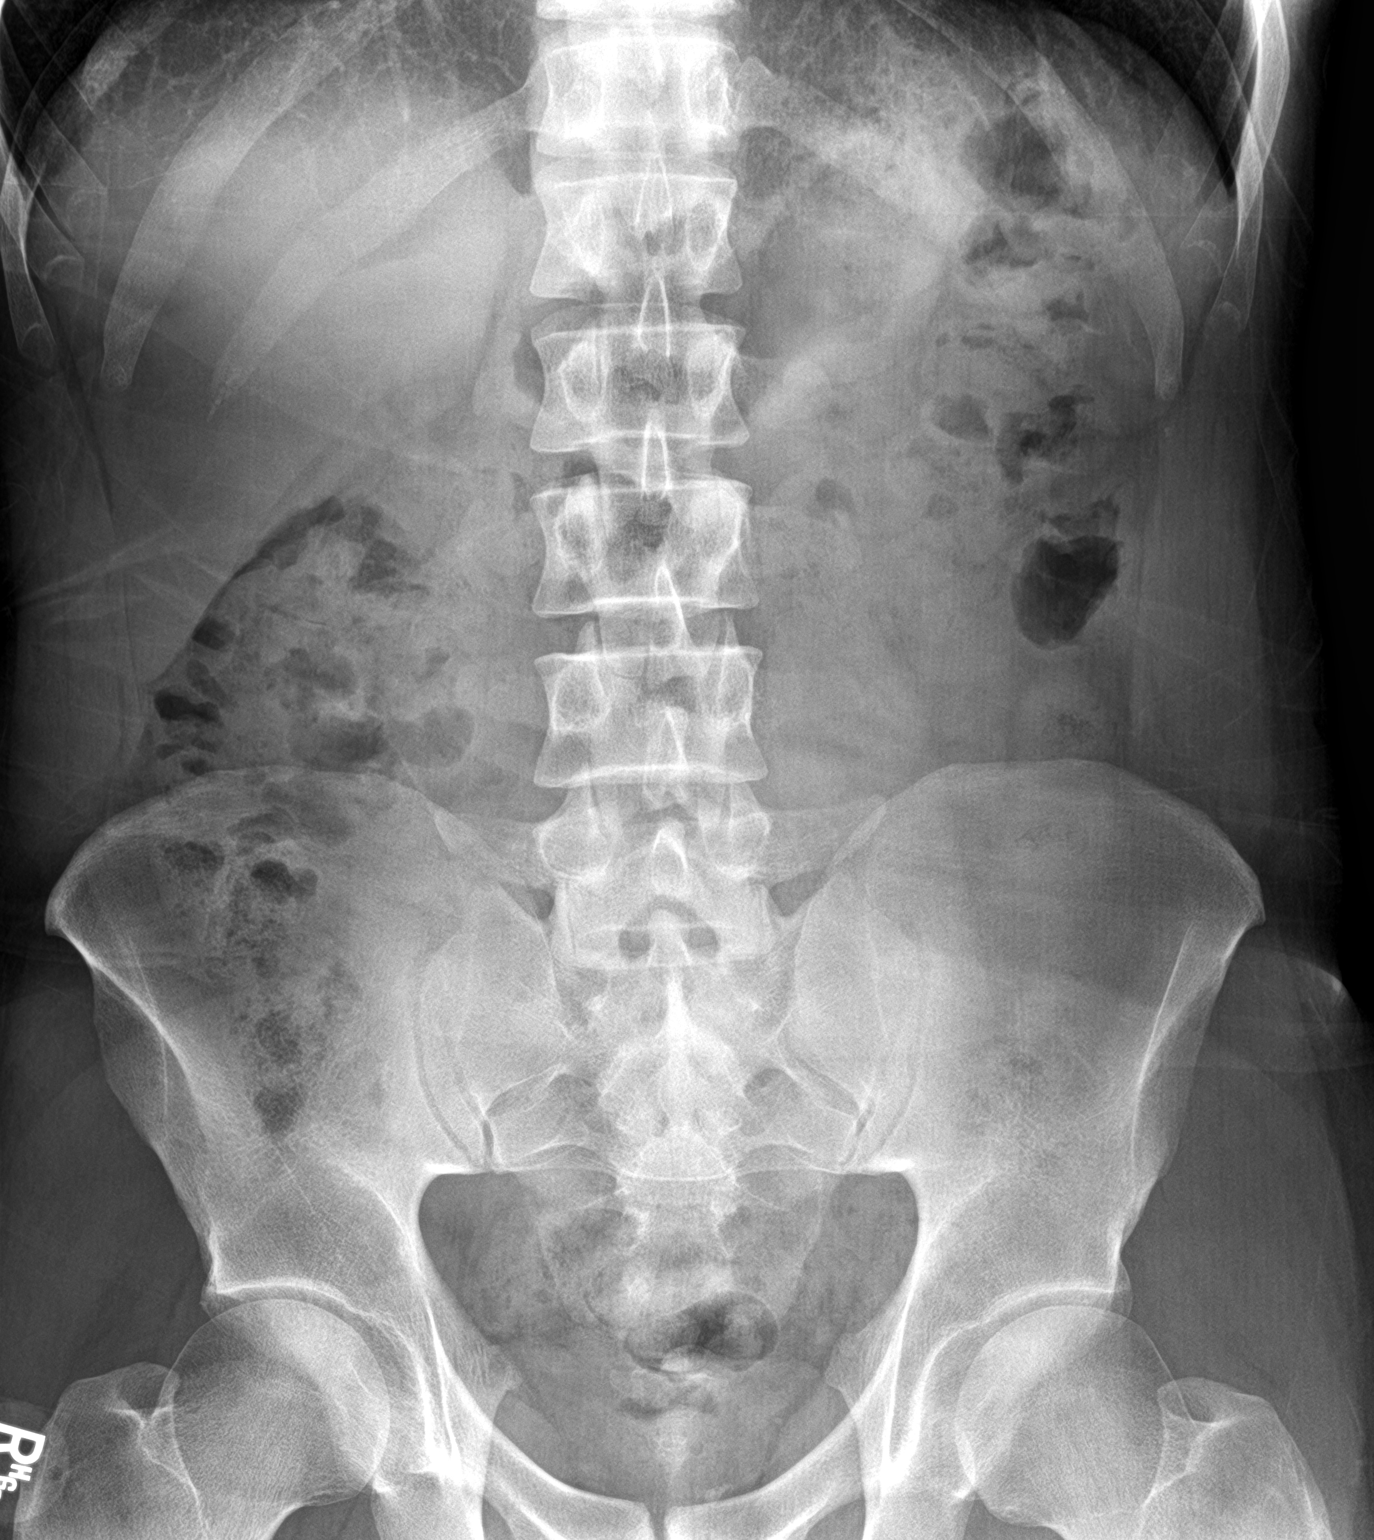

[1 of 1 positions shown; findings below may reference images not displayed]

FINDINGS: Soft tissue structures are unremarkable. No bowel distention. No
free air. Stool noted throughout the colon. Previously identified
distal right ureteral stone no longer visualized. Prostate
calcification again noted. No acute bony abnormality.
IMPRESSION: 1.  Stool noted throughout the colon.  No bowel distention.

2. No acute abnormality. Previously identified distal right ureteral
stone no longer visualized.

## 2018-09-13 ENCOUNTER — Encounter: Payer: Self-pay | Admitting: *Deleted

## 2018-09-14 ENCOUNTER — Ambulatory Visit: Payer: Medicare Other | Admitting: Certified Registered Nurse Anesthetist

## 2018-09-14 ENCOUNTER — Encounter
Admission: RE | Disposition: A | Discharge status: Home / Self Care | Payer: Self-pay | Source: Ambulatory Visit | Attending: Internal Medicine

## 2018-09-14 ENCOUNTER — Ambulatory Visit
Admission: RE | Admit: 2018-09-14 | Discharge: 2018-09-14 | Disposition: A | Discharge status: Home / Self Care | Payer: Medicare Other | Source: Ambulatory Visit | Attending: Internal Medicine | Admitting: Internal Medicine

## 2018-09-14 ENCOUNTER — Encounter: Payer: Self-pay | Admitting: *Deleted

## 2018-09-14 DIAGNOSIS — R1313 Dysphagia, pharyngeal phase: Secondary | ICD-10-CM | POA: Diagnosis not present

## 2018-09-14 DIAGNOSIS — G43909 Migraine, unspecified, not intractable, without status migrainosus: Secondary | ICD-10-CM | POA: Diagnosis not present

## 2018-09-14 DIAGNOSIS — K228 Other specified diseases of esophagus: Secondary | ICD-10-CM | POA: Insufficient documentation

## 2018-09-14 DIAGNOSIS — R1314 Dysphagia, pharyngoesophageal phase: Secondary | ICD-10-CM | POA: Diagnosis not present

## 2018-09-14 HISTORY — PX: ESOPHAGOGASTRODUODENOSCOPY: SHX5428

## 2018-09-14 HISTORY — DX: Personal history of urinary calculi: Z87.442

## 2018-09-14 SURGERY — EGD (ESOPHAGOGASTRODUODENOSCOPY)
Anesthesia: General

## 2018-09-14 MED ORDER — LIDOCAINE HCL (CARDIAC) PF 100 MG/5ML IV SOSY
PREFILLED_SYRINGE | INTRAVENOUS | Status: DC | PRN
  Administered 2018-09-14: 50 mg via INTRAVENOUS

## 2018-09-14 MED ORDER — LIDOCAINE HCL (PF) 2 % IJ SOLN
INTRAMUSCULAR | Status: AC
  Filled 2018-09-14: qty 10

## 2018-09-14 MED ORDER — SODIUM CHLORIDE 0.9 % IV SOLN
INTRAVENOUS | Status: DC
  Administered 2018-09-14: 1000 mL via INTRAVENOUS

## 2018-09-14 MED ORDER — PROPOFOL 10 MG/ML IV BOLUS
INTRAVENOUS | Status: DC | PRN
  Administered 2018-09-14: 70 mg via INTRAVENOUS

## 2018-09-14 MED ORDER — FENTANYL CITRATE (PF) 100 MCG/2ML IJ SOLN
INTRAMUSCULAR | Status: AC
  Filled 2018-09-14: qty 2

## 2018-09-14 MED ORDER — PROPOFOL 500 MG/50ML IV EMUL
INTRAVENOUS | Status: DC | PRN
  Administered 2018-09-14: 140 ug/kg/min via INTRAVENOUS

## 2018-09-14 MED ORDER — MIDAZOLAM HCL 2 MG/2ML IJ SOLN
INTRAMUSCULAR | Status: AC
  Filled 2018-09-14: qty 2

## 2018-09-14 MED ORDER — MIDAZOLAM HCL 2 MG/2ML IJ SOLN
INTRAMUSCULAR | Status: DC | PRN
  Administered 2018-09-14: 2 mg via INTRAVENOUS

## 2018-09-14 MED ORDER — PROPOFOL 500 MG/50ML IV EMUL
INTRAVENOUS | Status: AC
  Filled 2018-09-14: qty 50

## 2018-09-14 MED ORDER — FENTANYL CITRATE (PF) 100 MCG/2ML IJ SOLN
INTRAMUSCULAR | Status: DC | PRN
  Administered 2018-09-14: 50 ug via INTRAVENOUS

## 2018-09-14 NOTE — Op Note (Signed)
[facility] Gastroenterology Patient Name: Jeffery Jackson Procedure Date: 09/14/2018 9:58 AM MRN: [id number] Account #: [id number] Date of Birth: 08/21/1986 Admit Type: Outpatient Age: 32 Room: [facility] ENDO ROOM 2 Gender: Male Note Status: Finalized Procedure:            Upper GI endoscopy Indications:          Pharyngeal phase dysphagia, Esophageal dysphagia Providers:            Boykin Nearing. Norma Fredrickson MD, MD Referring MD:         No Local Md, MD (Referring MD) Medicines:            Propofol per Anesthesia Complications:        No immediate complications. Procedure:            Pre-Anesthesia Assessment:                       - The risks and benefits of the procedure and the                        sedation options and risks were discussed with the                        patient. All questions were answered and informed                        consent was obtained.                       - Patient identification and proposed procedure were                        verified prior to the procedure by the nurse. The                        procedure was verified in the procedure room.                       - ASA Grade Assessment: II - A patient with mild                        systemic disease.                       - After reviewing the risks and benefits, the patient                        was deemed in satisfactory condition to undergo the                        procedure.                       After obtaining informed consent, the endoscope was                        passed under direct vision. Throughout the procedure,                        the patient's blood pressure, pulse, and oxygen  saturations were monitored continuously. The Endoscope                        was introduced through the mouth, and advanced to the                        third part of duodenum. The upper GI endoscopy was                        accomplished without difficulty.  The patient tolerated                        the procedure well. Findings:      Mucosal changes including ringed esophagus, feline appearance,       longitudinal furrows and white plaques were found in the entire       esophagus. Esophageal findings were graded using the Eosinophilic       Esophagitis Endoscopic Reference Score (EoE-EREFS) as: Edema Grade 1       Present (decreased clarity or absence of vascular markings), Rings Grade       2 Moderate (distinct rings that do not occlude passage of diagnostic       8-10 mm endoscope), Exudates Grade 1 Mild (scattered white lesions       involving less than 10 percent of the esophageal surface area), Furrows       Grade 0 None (no vertical lines seen), Furrows Grade 1 Present (vertical       lines with or without visible depth) and Stricture none (no stricture       found). Biopsies were obtained from the proximal and distal esophagus       with cold forceps for histology of suspected eosinophilic esophagitis.      The entire examined stomach was normal.      The examined duodenum was normal.      The scope was withdrawn. Dilation was performed in the distal esophagus       with a Maloney dilator with mild resistance at 54 Fr. Impression:           - Esophageal mucosal changes consistent with                        eosinophilic esophagitis. Biopsied.                       - Normal stomach.                       - Normal examined duodenum.                       - Dilation performed in the distal esophagus. Recommendation:       - Patient has a contact number available for                        emergencies. The signs and symptoms of potential                        delayed complications were discussed with the patient.                        Return to normal activities tomorrow. Written discharge  instructions were provided to the patient.                       - Resume previous diet.                       - Continue  present medications.                       - Await pathology results.                       - Return to physician assistant in 1 month.                       - The findings and recommendations were discussed with                        the patient and their family. Procedure Code(s):    --- Professional ---                       [phone], Esophagogastroduodenoscopy, flexible, transoral;                        with biopsy, single or multiple                       43450, Dilation of esophagus, by unguided sound or                        bougie, single or multiple passes Diagnosis Code(s):    --- Professional ---                       R13.14, Dysphagia, pharyngoesophageal phase                       R13.13, Dysphagia, pharyngeal phase                       K22.8, Other specified diseases of esophagus CPT copyright 2017 American Medical Association. All rights reserved. The codes documented in this report are preliminary and upon coder review may  be revised to meet current compliance requirements. Stanton Kidney MD, MD 09/14/2018 10:22:51 AM This report has been signed electronically. Number of Addenda: 0 Note Initiated On: 09/14/2018 9:58 AM      [facility]

## 2018-09-14 NOTE — Anesthesia Post-op Follow-up Note (Signed)
Anesthesia QCDR form completed.        

## 2018-09-14 NOTE — Anesthesia Preprocedure Evaluation (Signed)
Anesthesia Evaluation  Patient identified by MRN, date of birth, ID band Patient awake    Reviewed: Allergy & Precautions, H&P , NPO status , Patient's Chart, lab work & pertinent test results  History of Anesthesia Complications Negative for: history of anesthetic complications  Airway Mallampati: II  TM Distance: >3 FB Neck ROM: full    Dental  (+) Chipped, Poor Dentition   Pulmonary neg pulmonary ROS,           Cardiovascular Exercise Tolerance: Good negative cardio ROS       Neuro/Psych  Headaches, negative psych ROS   GI/Hepatic negative GI ROS, Neg liver ROS, neg GERD  ,  Endo/Other  negative endocrine ROS  Renal/GU Renal diseasenegative Renal ROS  negative genitourinary   Musculoskeletal   Abdominal   Peds  Hematology negative hematology ROS (+)   Anesthesia Other Findings Past Medical History: No date: History of kidney stones No date: Kidney stone No date: Migraine No date: Urinary tract infection  Past Surgical History: No date: ABDOMINAL SURGERY  BMI    Body Mass Index:  25.06 kg/m      Reproductive/Obstetrics negative OB ROS                             Anesthesia Physical Anesthesia Plan  ASA: II  Anesthesia Plan: General   Post-op Pain Management:    Induction: Intravenous  PONV Risk Score and Plan: Propofol infusion and TIVA  Airway Management Planned: Natural Airway and Nasal Cannula  Additional Equipment:   Intra-op Plan:   Post-operative Plan:   Informed Consent: I have reviewed the patients History and Physical, chart, labs and discussed the procedure including the risks, benefits and alternatives for the proposed anesthesia with the patient or authorized representative who has indicated his/her understanding and acceptance.   Dental Advisory Given  Plan Discussed with: Anesthesiologist, CRNA and Surgeon  Anesthesia Plan Comments: (Patient  consented for risks of anesthesia including but not limited to:  - adverse reactions to medications - risk of intubation if required - damage to teeth, lips or other oral mucosa - sore throat or hoarseness - Damage to heart, brain, lungs or loss of life  Patient voiced understanding.)        Anesthesia Quick Evaluation

## 2018-09-14 NOTE — Transfer of Care (Signed)
Immediate Anesthesia Transfer of Care Note  Patient: Jeffery Jackson  Procedure(s) Performed: ESOPHAGOGASTRODUODENOSCOPY (EGD) (N/A )  Patient Location: PACU and Endoscopy Unit  Anesthesia Type:General  Level of Consciousness: awake, alert , oriented and patient cooperative  Airway & Oxygen Therapy: Patient Spontanous Breathing and Patient connected to nasal cannula oxygen  Post-op Assessment: Report given to RN and Post -op Vital signs reviewed and stable  Post vital signs: Reviewed and stable  Last Vitals:  Vitals Value Taken Time  BP 108/61 09/14/2018 10:23 AM  Temp 36.3 C 09/14/2018 10:23 AM  Pulse 75 09/14/2018 10:23 AM  Resp 13 09/14/2018 10:23 AM  SpO2 100 % 09/14/2018 10:23 AM  Vitals shown include unvalidated device data.  Last Pain:  Vitals:   09/14/18 0939  TempSrc: Oral  PainSc: 0-No pain         Complications: No apparent anesthesia complications

## 2018-09-14 NOTE — Anesthesia Postprocedure Evaluation (Signed)
Anesthesia Post Note  Patient: Jeffery Jackson  Procedure(s) Performed: ESOPHAGOGASTRODUODENOSCOPY (EGD) (N/A )  Patient location during evaluation: Endoscopy Anesthesia Type: General Level of consciousness: awake and alert Pain management: pain level controlled Vital Signs Assessment: post-procedure vital signs reviewed and stable Respiratory status: spontaneous breathing, nonlabored ventilation, respiratory function stable and patient connected to nasal cannula oxygen Cardiovascular status: blood pressure returned to baseline and stable Postop Assessment: no apparent nausea or vomiting Anesthetic complications: no     Last Vitals:  Vitals:   09/14/18 1033 09/14/18 1041  BP: 115/80   Pulse: 71 71  Resp: 14 14  Temp:    SpO2: 99% 100%    Last Pain:  Vitals:   09/14/18 1041  TempSrc:   PainSc: 0-No pain                 Cleda Mccreedy Piscitello

## 2018-09-14 NOTE — Interval H&P Note (Signed)
History and Physical Interval Note:  09/14/2018 9:47 AM  Lionel December  has presented today for surgery, with the diagnosis of OROPHARYNGEAL DYSPHAGIA  The various methods of treatment have been discussed with the patient and family. After consideration of risks, benefits and other options for treatment, the patient has consented to  Procedure(s): ESOPHAGOGASTRODUODENOSCOPY (EGD) (N/A) as a surgical intervention .  The patient's history has been reviewed, patient examined, no change in status, stable for surgery.  I have reviewed the patient's chart and labs.  Questions were answered to the patient's satisfaction.     [place], [place]

## 2018-09-14 NOTE — H&P (Signed)
Outpatient short stay form Pre-procedure 09/14/2018 9:43 AM Jeffery Jackson, M.D.  Primary Physician: [facility]  Reason for visit:  Dysphagia  History of present illness:   Patient with intermittent dysphagia who underwent a barium swallow in 12/2016 showing some slowing/stoppage of a barium tablet at the GEJ, compatible with possible Schatzki ring.    No current facility-administered medications for this encounter.   Medications Prior to Admission  Medication Sig Dispense Refill Last Dose  . Magnesium 300 MG CAPS Take by mouth.   Taking  . topiramate (TOPAMAX) 200 MG tablet TAKE 1 TABLET (200 MG TOTAL) BY MOUTH TWO (2) TIMES A DAY.  4 Taking     No Known Allergies   Past Medical History:  Diagnosis Date  . History of kidney stones   . Kidney stone   . Migraine   . Urinary tract infection     Review of systems:  Otherwise negative.    Physical Exam  Gen: Alert, oriented. Appears stated age.  HEENT: [place]/AT. PERRLA. Lungs: CTA, no wheezes. CV: RR nl S1, S2. Abd: soft, benign, no masses. BS+ Ext: No edema. Pulses 2+    Planned procedures: Proceed with EGD. The patient understands the nature of the planned procedure, indications, risks, alternatives and potential complications including but not limited to bleeding, infection, perforation, damage to internal organs and possible oversedation/side effects from anesthesia. The patient agrees and gives consent to proceed.  Please refer to procedure notes for findings, recommendations and patient disposition/instructions.     Jeffery Jackson, M.D. Gastroenterology 09/14/2018  9:43 AM

## 2018-09-15 ENCOUNTER — Encounter: Payer: Self-pay | Admitting: Internal Medicine

## 2018-09-16 LAB — SURGICAL PATHOLOGY

## 2019-08-29 ENCOUNTER — Other Ambulatory Visit: Payer: Self-pay

## 2019-08-29 DIAGNOSIS — Z20822 Contact with and (suspected) exposure to covid-19: Secondary | ICD-10-CM

## 2019-08-30 LAB — NOVEL CORONAVIRUS, NAA: SARS-CoV-2, NAA: NOT DETECTED
